# Patient Record
Sex: Female | Born: 1960 | Race: Black or African American | Hispanic: No | Smoking: Current some day smoker
Health system: Southern US, Community
[De-identification: ages and names within clinical notes are randomized; demographics above are authoritative.]

## PROBLEM LIST (undated history)

## (undated) DIAGNOSIS — A539 Syphilis, unspecified: Secondary | ICD-10-CM

## (undated) DIAGNOSIS — J189 Pneumonia, unspecified organism: Secondary | ICD-10-CM

## (undated) DIAGNOSIS — D219 Benign neoplasm of connective and other soft tissue, unspecified: Secondary | ICD-10-CM

## (undated) DIAGNOSIS — A599 Trichomoniasis, unspecified: Secondary | ICD-10-CM

## (undated) DIAGNOSIS — D649 Anemia, unspecified: Secondary | ICD-10-CM

## (undated) DIAGNOSIS — R51 Headache: Secondary | ICD-10-CM

## (undated) HISTORY — DX: Benign neoplasm of connective and other soft tissue, unspecified: D21.9

## (undated) HISTORY — DX: Trichomoniasis, unspecified: A59.9

## (undated) HISTORY — DX: Syphilis, unspecified: A53.9

## (undated) HISTORY — PX: TONSILLECTOMY: SUR1361

---

## 2000-05-18 ENCOUNTER — Emergency Department (HOSPITAL_COMMUNITY): Admission: EM | Admit: 2000-05-18 | Discharge: 2000-05-18 | Payer: Self-pay | Admitting: Emergency Medicine

## 2000-12-31 ENCOUNTER — Other Ambulatory Visit: Admission: RE | Admit: 2000-12-31 | Discharge: 2000-12-31 | Payer: Self-pay | Admitting: Unknown Physician Specialty

## 2001-01-02 ENCOUNTER — Encounter: Admission: RE | Admit: 2001-01-02 | Discharge: 2001-01-02 | Payer: Self-pay | Admitting: *Deleted

## 2001-01-02 ENCOUNTER — Encounter: Payer: Self-pay | Admitting: *Deleted

## 2002-05-04 ENCOUNTER — Other Ambulatory Visit: Admission: RE | Admit: 2002-05-04 | Discharge: 2002-05-04 | Payer: Self-pay | Admitting: *Deleted

## 2002-05-13 ENCOUNTER — Encounter: Payer: Self-pay | Admitting: *Deleted

## 2002-05-13 ENCOUNTER — Encounter: Admission: RE | Admit: 2002-05-13 | Discharge: 2002-05-13 | Payer: Self-pay | Admitting: *Deleted

## 2006-09-18 ENCOUNTER — Ambulatory Visit (HOSPITAL_COMMUNITY): Admission: RE | Admit: 2006-09-18 | Discharge: 2006-09-18 | Payer: Self-pay | Admitting: *Deleted

## 2006-09-18 ENCOUNTER — Encounter (INDEPENDENT_AMBULATORY_CARE_PROVIDER_SITE_OTHER): Payer: Self-pay | Admitting: Specialist

## 2007-10-08 ENCOUNTER — Ambulatory Visit (HOSPITAL_COMMUNITY): Admission: RE | Admit: 2007-10-08 | Discharge: 2007-10-08 | Payer: Self-pay | Admitting: *Deleted

## 2007-12-03 ENCOUNTER — Other Ambulatory Visit: Admission: RE | Admit: 2007-12-03 | Discharge: 2007-12-03 | Payer: Self-pay | Admitting: Obstetrics and Gynecology

## 2008-12-27 ENCOUNTER — Other Ambulatory Visit: Admission: RE | Admit: 2008-12-27 | Discharge: 2008-12-27 | Payer: Self-pay | Admitting: Obstetrics and Gynecology

## 2008-12-28 ENCOUNTER — Encounter: Admission: RE | Admit: 2008-12-28 | Discharge: 2008-12-28 | Payer: Self-pay | Admitting: Obstetrics and Gynecology

## 2010-01-04 ENCOUNTER — Other Ambulatory Visit: Admission: RE | Admit: 2010-01-04 | Discharge: 2010-01-04 | Payer: Self-pay | Admitting: Obstetrics and Gynecology

## 2010-02-12 ENCOUNTER — Encounter: Admission: RE | Admit: 2010-02-12 | Discharge: 2010-02-12 | Payer: Self-pay | Admitting: Obstetrics and Gynecology

## 2011-02-07 ENCOUNTER — Other Ambulatory Visit: Payer: Self-pay | Admitting: Nurse Practitioner

## 2011-02-07 ENCOUNTER — Other Ambulatory Visit (HOSPITAL_COMMUNITY)
Admission: RE | Admit: 2011-02-07 | Discharge: 2011-02-07 | Disposition: A | Discharge status: Home / Self Care | Payer: PRIVATE HEALTH INSURANCE | Source: Ambulatory Visit | Attending: Obstetrics and Gynecology | Admitting: Obstetrics and Gynecology

## 2011-02-07 DIAGNOSIS — Z01419 Encounter for gynecological examination (general) (routine) without abnormal findings: Secondary | ICD-10-CM | POA: Insufficient documentation

## 2011-05-10 NOTE — Op Note (Signed)
NAMENYAJA, DUBUQUE            ACCOUNT NO.:  [id number]   MEDICAL RECORD NO.:  [id number]          PATIENT TYPE:  AMB   LOCATION:  SDC                           FACILITY:  [facility]   PHYSICIAN:  [place] B. Earlene Plater, M.D.  DATE OF BIRTH:  11-Jul-1961   DATE OF PROCEDURE:  09/18/2006  DATE OF DISCHARGE:                                 OPERATIVE REPORT   PREOPERATIVE DIAGNOSES:  1. Excessive menstrual bleeding.  2. Submucous myoma.   POSTOPERATIVE DIAGNOSES:  1. Excessive menstrual bleeding.  2. Submucous myoma.   PROCEDURE:  Hysteroscopic resection of submucous myoma.   ANESTHESIA:  LMA general and 20 mL of Nesacaine 1% paracervical block.   SPECIMENS:  Submucous myoma submitted to pathology.   ESTIMATED BLOOD LOSS:  Minimal.   FLUID DEFICIT:  200 mL sorbitol.   COMPLICATIONS:  None.   INDICATIONS FOR PROCEDURE:  The patient with a history of heavy and  irregular menstrual bleeding.  Sono histogram showed a submucous myoma as  the only abnormality other than some other small intramural fibroids.  The  patient was advised of the risks of surgery including infection, bleeding,  uterine perforation, fluid overload, and damage to surrounding organs.   PROCEDURE:  The patient was taken to the operating room and LMA general  anesthesia obtained.  She was placed in the Mylo stirrups and prepped and  draped in the standard fashion and bladder emptied with in and out cath.  Exam under anesthesia showed a slightly enlarged anteverted uterus.  No  adnexal masses.   Speculum inserted.  Paracervical block placed. The cervix dilated to a #21  without difficulty.  The resectoscope was then inserted and the fibroid  noted to be arising from the anterior lateral portion of the uterus on the  left.  This was excised with the single loop resectoscope to its base.  Hemostasis obtained.  Debris was removed.   Instruments were removed.  Cervix hemostatic.   The patient tolerated the procedure  well without complications.  She was  taken to the recovery room awake, alert and in stable condition.      Gerri Spore B. Earlene Plater, M.D.  Electronically Signed     WBD/MEDQ  D:  09/19/2006  T:  09/22/2006  Job:  [id number]

## 2012-01-17 ENCOUNTER — Encounter: Payer: PRIVATE HEALTH INSURANCE | Admitting: Obstetrics & Gynecology

## 2012-05-23 HISTORY — PX: GANGLION CYST EXCISION: SHX1691

## 2012-06-30 ENCOUNTER — Encounter (HOSPITAL_COMMUNITY): Payer: Self-pay | Admitting: General Practice

## 2012-06-30 ENCOUNTER — Observation Stay (HOSPITAL_COMMUNITY)
Admission: AD | Admit: 2012-06-30 | Discharge: 2012-07-01 | Disposition: A | Discharge status: Home / Self Care | Payer: BC Managed Care – PPO | Source: Ambulatory Visit | Attending: Family Medicine | Admitting: Family Medicine

## 2012-06-30 ENCOUNTER — Ambulatory Visit (INDEPENDENT_AMBULATORY_CARE_PROVIDER_SITE_OTHER): Payer: BC Managed Care – PPO | Admitting: Family Medicine

## 2012-06-30 ENCOUNTER — Ambulatory Visit: Payer: BC Managed Care – PPO

## 2012-06-30 VITALS — BP 108/60 | HR 127 | Temp 98.2°F | Resp 24 | Ht 63.0 in | Wt 166.0 lb

## 2012-06-30 DIAGNOSIS — J189 Pneumonia, unspecified organism: Secondary | ICD-10-CM

## 2012-06-30 DIAGNOSIS — Z23 Encounter for immunization: Secondary | ICD-10-CM | POA: Insufficient documentation

## 2012-06-30 DIAGNOSIS — R059 Cough, unspecified: Secondary | ICD-10-CM

## 2012-06-30 DIAGNOSIS — R05 Cough: Secondary | ICD-10-CM

## 2012-06-30 DIAGNOSIS — F17200 Nicotine dependence, unspecified, uncomplicated: Secondary | ICD-10-CM | POA: Insufficient documentation

## 2012-06-30 HISTORY — DX: Pneumonia, unspecified organism: J18.9

## 2012-06-30 HISTORY — DX: Headache: R51

## 2012-06-30 HISTORY — DX: Anemia, unspecified: D64.9

## 2012-06-30 LAB — POCT CBC
Granulocyte percent: 88.7 %G — AB (ref 37–80)
HCT, POC: 49.4 % — AB (ref 37.7–47.9)
Hemoglobin: 15.9 g/dL (ref 12.2–16.2)
Lymph, poc: 2.5 (ref 0.6–3.4)
MCH, POC: 32.3 pg — AB (ref 27–31.2)
MCHC: 32.2 g/dL (ref 31.8–35.4)
MCV: 100.3 fL — AB (ref 80–97)
MID (cbc): 1 — AB (ref 0–0.9)
MPV: 9 fL (ref 0–99.8)
POC Granulocyte: 27.3 — AB (ref 2–6.9)
POC LYMPH PERCENT: 8.2 %L — AB (ref 10–50)
POC MID %: 3.1 %M (ref 0–12)
Platelet Count, POC: 379 10*3/uL (ref 142–424)
RBC: 4.93 M/uL (ref 4.04–5.48)
RDW, POC: 13.5 %
WBC: 30.8 10*3/uL — AB (ref 4.6–10.2)

## 2012-06-30 LAB — CBC
HCT: 42.2 % (ref 36.0–46.0)
Hemoglobin: 15 g/dL (ref 12.0–15.0)
MCH: 33.3 pg (ref 26.0–34.0)
MCHC: 35.5 g/dL (ref 30.0–36.0)

## 2012-06-30 LAB — ACETAMINOPHEN LEVEL: Acetaminophen (Tylenol), Serum: <15 ug/mL (ref 10–30)

## 2012-06-30 MED ORDER — CEFTRIAXONE SODIUM 1 G IJ SOLR
1.0000 g | Freq: Once | INTRAMUSCULAR | Status: AC
  Administered 2012-06-30: 1 g via INTRAMUSCULAR

## 2012-06-30 MED ORDER — SODIUM CHLORIDE 0.9 % IV BOLUS (SEPSIS)
1000.0000 mL | Freq: Once | INTRAVENOUS | Status: AC
  Administered 2012-06-30: 1000 mL via INTRAVENOUS

## 2012-06-30 MED ORDER — IBUPROFEN 600 MG PO TABS
600.0000 mg | ORAL_TABLET | Freq: Four times a day (QID) | ORAL | Status: DC | PRN
  Filled 2012-06-30: qty 1

## 2012-06-30 MED ORDER — ENOXAPARIN SODIUM 40 MG/0.4ML ~~LOC~~ SOLN
40.0000 mg | SUBCUTANEOUS | Status: DC
  Administered 2012-06-30: 40 mg via SUBCUTANEOUS
  Filled 2012-06-30 (×2): qty 0.4

## 2012-06-30 MED ORDER — SODIUM CHLORIDE 0.9 % IJ SOLN
3.0000 mL | Freq: Two times a day (BID) | INTRAMUSCULAR | Status: DC
  Administered 2012-06-30 – 2012-07-01 (×2): 3 mL via INTRAVENOUS

## 2012-06-30 MED ORDER — ONDANSETRON HCL 4 MG/2ML IJ SOLN: 4.0000 mg | Freq: Four times a day (QID) | INTRAMUSCULAR | Status: DC | PRN

## 2012-06-30 MED ORDER — SODIUM CHLORIDE 0.9 % IJ SOLN: 3.0000 mL | INTRAMUSCULAR | Status: DC | PRN

## 2012-06-30 MED ORDER — DEXTROSE 5 % IV SOLN
500.0000 mg | INTRAVENOUS | Status: DC
  Administered 2012-06-30: 500 mg via INTRAVENOUS
  Filled 2012-06-30 (×2): qty 500

## 2012-06-30 MED ORDER — CEFTRIAXONE SODIUM 1 G IJ SOLR: 1.0000 g | INTRAMUSCULAR | Status: DC

## 2012-06-30 MED ORDER — ONDANSETRON HCL 4 MG PO TABS: 4.0000 mg | ORAL_TABLET | Freq: Four times a day (QID) | ORAL | Status: DC | PRN

## 2012-06-30 MED ORDER — PNEUMOCOCCAL VAC POLYVALENT 25 MCG/0.5ML IJ INJ
0.5000 mL | INJECTION | INTRAMUSCULAR | Status: AC
  Administered 2012-07-01: 0.5 mL via INTRAMUSCULAR
  Filled 2012-06-30: qty 0.5

## 2012-06-30 MED ORDER — DEXTROSE 5 % IV SOLN
1.0000 g | INTRAVENOUS | Status: DC
  Filled 2012-06-30: qty 10

## 2012-06-30 MED ORDER — SODIUM CHLORIDE 0.9 % IV SOLN: 250.0000 mL | INTRAVENOUS | Status: DC | PRN

## 2012-06-30 NOTE — Progress Notes (Signed)
Subjective:    Patient ID: [name], female    DOB: 1961/03/03, 51 y.o.   MRN: [id number]  HPI 51 year old female with productive cough and shortness of breath progressively worsening since July 4th. She states at onset she had a fever of 102, but has not had one for several days. She has cough that is especially bothersome when she is laying down. She had been taking Thera-flu which was not helping so last night she switched to an OTC tylenol cough and cold preparation. States she was "drinking it from the bottle" every 1-2 hours for her cough. Denies sore throat, otalgia, nausea, or vomiting. Does have some nasal congestion and sinus pressure.   She is a smoker but "quit" at onset of this illness. No history of asthma or other lung disease.     Review of Systems  All other systems reviewed and are negative.       Objective:   Physical Exam  Constitutional: She appears well-developed and well-nourished.  HENT:  Head: Normocephalic and atraumatic.  Right Ear: Hearing, tympanic membrane, external ear and ear canal normal.  Left Ear: Hearing, tympanic membrane, external ear and ear canal normal.  Mouth/Throat: Uvula is midline, oropharynx is clear and moist and mucous membranes are normal.  Eyes: Conjunctivae are normal.  Neck: Normal range of motion.  Cardiovascular: Normal rate, regular rhythm and normal heart sounds.   Pulmonary/Chest: Effort normal and breath sounds normal.  Lymphadenopathy:    She has no cervical adenopathy.  Neurological: She is alert.  Psychiatric: She has a normal mood and affect. Her behavior is normal. Judgment and thought content normal.    Results for orders placed in visit on 06/30/12  POCT CBC      Component Value Range   WBC 30.8 (*) 4.6 - 10.2 K/uL   Lymph, poc 2.5  0.6 - 3.4   POC LYMPH PERCENT 8.2 (*) 10 - 50 %L   MID (cbc) 1.0 (*) 0 - 0.9   POC MID % 3.1  0 - 12 %M   POC Granulocyte 27.3 (*) 2 - 6.9   Granulocyte percent 88.7 (*) 37  - 80 %G   RBC 4.93  4.04 - 5.48 M/uL   Hemoglobin 15.9  12.2 - 16.2 g/dL   HCT, POC 40.9 (*) 81.1 - 47.9 %   MCV 100.3 (*) 80 - 97 fL   MCH, POC 32.3 (*) 27 - 31.2 pg   MCHC 32.2  31.8 - 35.4 g/dL   RDW, POC 91.4     Platelet Count, POC 379  142 - 424 K/uL   MPV 9.0  0 - 99.8 fL     [facility] reading (PRIMARY) by  Dr. Patsy Lager as right lower lobe pneumonia with retrocardiac infiltrate and minimal effusion.  Pulso ox on 2 L O2 is 98%  Seen with Dr. Patsy Lager.      Assessment & Plan:   1. Cough  DG Chest 2 View, POCT CBC  2. Pneumonia  Direct admission to [facility].  Patient declined EMS transfer and will go via private vehicle.  cefTRIAXone (ROCEPHIN) injection 1 g, DISCONTINUED: cefTRIAXone (ROCEPHIN) injection 1 g   Addnd per J. Copland, MD:  Also mentioned ?overuse of tylenol containing cold product to inpatient team so they many monitor her liver function.  Rash on wrists does not appear consistent with RMSF, but asked pt to be sure to point this rash out to inpatient team so they may monitor it as  well.

## 2012-06-30 NOTE — H&P (Signed)
Family Medicine Teaching [facility] Admission History and Physical  Patient name: Amy [name] Medical record number: [id number] Date of birth: 05-13-1961 Age: 51 y.o. Gender: female  Primary Care Provider: No primary provider on file.  Chief Complaint: cough History of Present Illness: Amy [name] is a 51 y.o. year old female with no past medical history presenting with cough and shortness of breath x5 days.  Patient reports being in her usual state of good health until July 4th.  At that time she developed some cold symptoms of cough, headache, nasal congestion, and sore/scratchy throat.  She started taking Thera-Flu which did not seem to help much.  She switched to a liquid cold medicine containing tylenol which did seem to help the cough (reports taking some out of the bottle every 1-2 hours overnight).  The cough is non-productive.  Reports fever to 102.5 on the 4th and 5th.  Started to feel a little better over the weekend, but got worse with chills and sweats yesterday.  Tolerating liquid diet at home.  No nausea, vomiting, abdominal, diarrhea, urinary symptoms.  Given worsening of symptoms, she went to Urgent Care, where a CXR was concerning for pneumonia and her white count was elevated at 30,000.   There is no problem list on file for this patient.  Past Medical History: Past Medical History  Diagnosis Date  . Pneumonia 06/30/12  . Anemia   . Headache     "mostly related to weather changing"  . Arthritis     right wrist    Past Surgical History: Past Surgical History  Procedure Date  . Tonsillectomy     "when I was a little girl"  . Cesarean section 1993  . Ganglion cyst excision 05/2012    right wrist    Social History: History   Social History  . Marital Status: Legally Separated    Spouse Name: N/A    Number of Children: N/A  . Years of Education: N/A   Social History Main Topics  . Smoking status: Former Smoker -- 0.5 packs/day for 32 years   Types: Cigarettes    Quit date: 06/25/2012  . Smokeless tobacco: Never Used  . Alcohol Use: 6.0 oz/week    6 Cans of beer, 4 Glasses of wine per week  . Drug Use: No  . Sexually Active: Yes   Other Topics Concern  . None   Social History Narrative  . None    Family History: History reviewed. No pertinent family history.  Allergies: No Known Allergies  Current Facility-Administered Medications  Medication Dose Route Frequency Provider Last Rate Last Dose  . 0.9 %  sodium chloride infusion  250 mL Intravenous PRN Phebe Colla, MD      . azithromycin (ZITHROMAX) 500 mg in dextrose 5 % 250 mL IVPB  500 mg Intravenous Q24H Phebe Colla, MD      . cefTRIAXone (ROCEPHIN) 1 g in dextrose 5 % 50 mL IVPB  1 g Intravenous Q24H Phebe Colla, MD      . enoxaparin (LOVENOX) injection 40 mg  40 mg Subcutaneous Q24H Phebe Colla, MD      . ibuprofen (ADVIL,MOTRIN) tablet 600 mg  600 mg Oral Q6H PRN Phebe Colla, MD      . ondansetron [facility]) tablet 4 mg  4 mg Oral Q6H PRN Phebe Colla, MD       Or  . ondansetron [facility]) injection 4 mg  4 mg Intravenous Q6H PRN Denny Peon  Marlan Palau, MD      . pneumococcal 23 valent vaccine (PNU-IMMUNE) injection 0.5 mL  0.5 mL Intramuscular Tomorrow-1000 Carney Living, MD      . sodium chloride 0.9 % injection 3 mL  3 mL Intravenous Q12H Phebe Colla, MD      . sodium chloride 0.9 % injection 3 mL  3 mL Intravenous PRN Phebe Colla, MD       Review Of Systems: Per HPI with the following additions: none Otherwise 12 point review of systems was performed and was unremarkable.  Physical Exam: Pulse: 107  Blood Pressure: 96/64 RR: 22   O2: 94 on RA Temp: 98.9  General: alert, cooperative, appears stated age, no distress and does have cough HEENT: extra ocular movement intact, sclera clear, anicteric, oropharynx clear, no lesions, neck supple with midline trachea and MMM Heart: S1, S2 normal, no murmur, rub or gallop, regular rate and rhythm Lungs: unlabored  breathing, does appear mildly short of breath with prolonged talking, good air movement, fine rales in right lung base, no wheezes or rhonchi Abdomen: abdomen is soft without significant tenderness, masses, organomegaly or guarding Extremities: extremities normal, atraumatic, no cyanosis or edema Skin:no rashes, no ecchymoses Neurology: normal without focal findings, mental status, speech normal, alert and oriented x3 and cranial nerves 2-12 intact  Labs and Imaging: No results found for this basename: na, k, cl, co2, bun, creatinine, glucose   Lab Results  Component Value Date   WBC 30.8* 06/30/2012   HGB 15.9 06/30/2012   HCT 49.4* 06/30/2012   MCV 100.3* 06/30/2012   CXR: Bibasilar pulmonary infiltrates.  Assessment and Plan: Amy [name] is a 51 y.o. year old female presenting with cough and shortness of breath with CXR concerning for RLL pneumonia.  # Pneumonia: Community acquired.  With tachycardia, tachypnea and elevated white count she technically meets sepsis criteria.  However, she appears clinically well and is hemodynamically stable.   - ceftriaxone and azithromycin IV - will likely be able to transition to PO antibiotics tomorrow - will give 1L NS bolus for tachycardia although she does not appear dry on exam - O2 as needed to keep O2 sats >90% - IS - monitor for fevers - recheck CBC in the morning - given smoking history, will give pneumovax prior to discharge  # Tachycardia: Likely related to acute infection. - will give bolus - monitor  # Tobacco abuse: Last cigarette on 7/3; no plans to restart. - will order tobacco cessation to offer resources - pt declined nicotine patch at this time  FEN/GI: full liquid diet (per patient request); 1L NS bolus then saline lock Prophylaxis: SQ lovenox Disposition: admit for observation  [name], [name] 06/30/2012, 4:29 PM

## 2012-06-30 NOTE — H&P (Signed)
Family Medicine Teaching Service Attending Note  I interviewed and examined patient Amy [name] and reviewed their tests and x-rays.  I discussed with Dr. Elwyn Reach and reviewed their note for today.  I agree with their assessment and plan.     Additionally  Resting comfortably on room air.  Normal conversation Lungs - bilateral crackles at lower lobes no wheeze Chest xray - milld bilateral pneumonia Pneumonia with very low PORT score indicating very low likelihood of complications Treat over night with antibiotics if no worsening anticipate discharge tomorrow

## 2012-07-01 LAB — CBC
MCV: 95.2 fL (ref 78.0–100.0)
Platelets: 317 10*3/uL (ref 150–400)
RBC: 4.16 MIL/uL (ref 3.87–5.11)
RDW: 13.1 % (ref 11.5–15.5)
WBC: 20.2 10*3/uL — ABNORMAL HIGH (ref 4.0–10.5)

## 2012-07-01 MED ORDER — DIPHENHYDRAMINE HCL 25 MG PO CAPS
25.0000 mg | ORAL_CAPSULE | Freq: Three times a day (TID) | ORAL | Status: DC | PRN
  Administered 2012-07-01: 25 mg via ORAL
  Filled 2012-07-01: qty 1

## 2012-07-01 MED ORDER — AZITHROMYCIN 250 MG PO TABS: ORAL_TABLET | ORAL | Status: AC

## 2012-07-01 MED ORDER — DEXTROSE 5 % IV SOLN
1.0000 g | INTRAVENOUS | Status: DC
  Administered 2012-07-01: 1 g via INTRAVENOUS
  Filled 2012-07-01 (×2): qty 10

## 2012-07-01 NOTE — Progress Notes (Signed)
UR complete 

## 2012-07-01 NOTE — Discharge Summary (Signed)
Family Medicine Teaching Service Discharge Summary Amy Curia, MD, PGY-1   07/01/2012 12:15 PM  Amy [name] DOB: 1961/07/01 MRN: [id number]  Date of Admission: 06/30/2012 Date of Discharge: 07/01/12  PCP: No primary provider on file. Consultants: none  Reason for Admission: cough/CAP  Discharge Diagnosis Primary 1. CAP Secondary 1. Tobacco abuse  Hospital Course: [name] is a 51 y.o. year old female presenting with cough and shortness of breath with CXR concerning for RLL pneumonia.  1. CAP - Pt presented with tachycardia, tachypnea, and elevated WBC at 30.8 and was given rocephin/azithromycin.  WBC trended down to 20.2 day of discharge.  Vital signs were stable and pt was afebrile and required no supplemental O2 overnight with O2 sats remaining stable at or above 90%. Pt stated she quit smoking a few days prior to discharge and was given tobacco cessation resources during discharge.  She was discharged home on 4 more days of azithromycin 250 mg and told to follow-up with PCP.  2. Tobacco abuse - Pt states she had her last cigarette on 7/3 with no plans to restart.  Ordered tobacco cessation to offer resources during hospitalization and pt declined nicotine patch.  F/u to provide more support as outpatient.  Day of discharge Physical Exam: VS: BP 123/81  Pulse 97  Temp 99 F (37.2 C) (Oral)  Resp 18  Ht 5\' 3"  (1.6 m)  Wt 166 lb (75.297 kg)  BMI 29.41 kg/m2  SpO2 90%  LMP 11/01/2011 Gen: NAD, pleasant, sitting in hospital bed CV: RRR, no murmurs, rubs, or gallops Pulm: mild inspiratory crackles in bilateral lower lobes, no increased WOB Abd: s/nt/nd, NABS  Procedures:   CXR 7/9: bibasilar pulmonary infiltrates  Discharge Medications Medication List  As of 07/01/2012 12:15 PM   START taking these medications         azithromycin 250 MG tablet   Commonly known as: ZITHROMAX   Take one pill per day for four days.         CONTINUE taking these  medications         pseudoephedrine-acetaminophen 30-500 MG Tabs   Commonly known as: TYLENOL SINUS          Where to get your medications    These are the prescriptions that you need to pick up. We sent them to a specific pharmacy, so you will need to go there to get them.   [id number] - [place], [place] - [address]    [address]  [id number]    Phone: [phone]        azithromycin 250 MG tablet            Pertinent Hospital Labs  WBC 30.8-->20.2 on discharge Tylenol level 7/9 <15  Discharge instructions: see AVS  Condition at discharge: stable  Disposition: to home  Pending Tests: None  Follow up: Follow-up Information    Follow up with your PCP at [facility] in 2 weeks. (As needed)          Follow up Issues:  - For resolving symptoms - For tobacco cessation support

## 2012-07-01 NOTE — Progress Notes (Signed)
Patient was discharged home with family.  Patient was given discharged instructions and verbalized understanding of instructions.  Roland Rack, RN

## 2012-07-01 NOTE — Discharge Summary (Signed)
I have reviewed this discharge summary and agree.    

## 2012-09-24 ENCOUNTER — Other Ambulatory Visit: Payer: Self-pay | Admitting: Internal Medicine

## 2012-09-24 DIAGNOSIS — Z1231 Encounter for screening mammogram for malignant neoplasm of breast: Secondary | ICD-10-CM

## 2012-09-28 ENCOUNTER — Ambulatory Visit
Admission: RE | Admit: 2012-09-28 | Discharge: 2012-09-28 | Disposition: A | Discharge status: Home / Self Care | Payer: BC Managed Care – PPO | Source: Ambulatory Visit | Attending: Internal Medicine | Admitting: Internal Medicine

## 2012-09-28 DIAGNOSIS — Z1231 Encounter for screening mammogram for malignant neoplasm of breast: Secondary | ICD-10-CM

## 2012-10-15 ENCOUNTER — Ambulatory Visit: Payer: PRIVATE HEALTH INSURANCE

## 2013-03-03 ENCOUNTER — Ambulatory Visit (HOSPITAL_COMMUNITY)
Admission: RE | Admit: 2013-03-03 | Discharge: 2013-03-03 | Disposition: A | Discharge status: Home / Self Care | Payer: BC Managed Care – PPO | Source: Ambulatory Visit | Attending: Physician Assistant | Admitting: Physician Assistant

## 2013-03-03 ENCOUNTER — Ambulatory Visit: Payer: BC Managed Care – PPO

## 2013-03-03 ENCOUNTER — Ambulatory Visit (INDEPENDENT_AMBULATORY_CARE_PROVIDER_SITE_OTHER): Payer: BC Managed Care – PPO | Admitting: Emergency Medicine

## 2013-03-03 VITALS — BP 110/70 | HR 57 | Temp 97.9°F | Resp 16 | Ht 63.5 in | Wt 171.0 lb

## 2013-03-03 DIAGNOSIS — R1032 Left lower quadrant pain: Secondary | ICD-10-CM | POA: Insufficient documentation

## 2013-03-03 DIAGNOSIS — N949 Unspecified condition associated with female genital organs and menstrual cycle: Secondary | ICD-10-CM | POA: Insufficient documentation

## 2013-03-03 DIAGNOSIS — A599 Trichomoniasis, unspecified: Secondary | ICD-10-CM

## 2013-03-03 DIAGNOSIS — K5732 Diverticulitis of large intestine without perforation or abscess without bleeding: Secondary | ICD-10-CM | POA: Insufficient documentation

## 2013-03-03 LAB — POCT UA - MICROSCOPIC ONLY
Casts, Ur, LPF, POC: NEGATIVE
Crystals, Ur, HPF, POC: NEGATIVE
Trichomonas, UA: POSITIVE
Yeast, UA: NEGATIVE

## 2013-03-03 LAB — POCT CBC
HCT, POC: 47.5 % (ref 37.7–47.9)
Lymph, poc: 4 — AB (ref 0.6–3.4)
MCH, POC: 31.7 pg — AB (ref 27–31.2)
MCHC: 31.2 g/dL — AB (ref 31.8–35.4)
MCV: 101.7 fL — AB (ref 80–97)
MID (cbc): 0.8 (ref 0–0.9)
POC LYMPH PERCENT: 40.5 %L (ref 10–50)
Platelet Count, POC: 319 10*3/uL (ref 142–424)
RDW, POC: 14.2 %

## 2013-03-03 LAB — POCT URINALYSIS DIPSTICK
Bilirubin, UA: NEGATIVE
Blood, UA: NEGATIVE
Leukocytes, UA: NEGATIVE
Nitrite, UA: NEGATIVE
Urobilinogen, UA: 0.2
pH, UA: 5.5

## 2013-03-03 MED ORDER — CEFTRIAXONE SODIUM 1 G IJ SOLR
1.0000 g | Freq: Once | INTRAMUSCULAR | Status: AC
  Administered 2013-03-03: 1 g via INTRAMUSCULAR

## 2013-03-03 MED ORDER — IOHEXOL 300 MG/ML  SOLN
50.0000 mL | Freq: Once | INTRAMUSCULAR | Status: AC | PRN
  Administered 2013-03-03: 50 mL via ORAL

## 2013-03-03 MED ORDER — IOHEXOL 300 MG/ML  SOLN
100.0000 mL | Freq: Once | INTRAMUSCULAR | Status: AC | PRN
  Administered 2013-03-03: 100 mL via INTRAVENOUS

## 2013-03-03 MED ORDER — METRONIDAZOLE 500 MG PO TABS: 500.0000 mg | ORAL_TABLET | Freq: Two times a day (BID) | ORAL | Status: DC

## 2013-03-03 MED ORDER — CIPROFLOXACIN HCL 500 MG PO TABS: 500.0000 mg | ORAL_TABLET | Freq: Two times a day (BID) | ORAL | Status: DC

## 2013-03-03 NOTE — Patient Instructions (Signed)
Do not eat or drink until after your CT scan today.  Please remain at the imaging center after your CT until you have spoken to [place] on the phone and we have told you your findings and treatment plan. The tech may tell you that it is OK to leave, but please stay until you have instructions from [place]. We suggest your partner be STD tested and treated.  Please use condoms with every sex act.  If you and your partner are not treated for STDs then you will continue to pass the STD to each other.  Remember, condoms do not protect against all STDs!   We will call you with your lab results once we receive them.   If you have not heard from me in 2 weeks, please contact me. The fastest way to get your results is to register for My Chart (see the instructions on the last page of this printout).  We also suggest you set up a colonoscopy since you are now 50.

## 2013-03-03 NOTE — Progress Notes (Signed)
Subjective:    Patient ID: Amy [name], female    DOB: 12-26-1960, 52 y.o.   MRN: [id number]  HPI  A 52 year old female presents with LLQ pain since 03/02/2013.    Pt admits to sharp, stabbing pain in LLQ that is worse with movement and when she is sitting upright.  Admits to increased flatus and relief with BMs and lying down.  Her appetite has been normal and she reports no f/c, n/c, diarrhea, constipation, hematuria, dysuria, polyuria, vaginal discharge, abnormal weight gain or loss.  Her last BM was this morning where she had decreased stool compared to normal.      Her last papsmear was 11/2013 and was normal.  Has a hx of fibroids.  Pt is sexually active and uses no contraceptives.  She has a hx of trichomonas and syphilis which were both tx and resolved.  She is perimenopausal and recently had spotting in 01/2013.  Denies abnormal vaginal bleeding or bloating.   Past Medical History  Diagnosis Date  . Pneumonia 06/30/12  . Anemia   . Headache     "mostly related to weather changing"  . Trichomonas 2 years ago    tx and resolved  . Syphilis 2-3 years ago    tx and resolved  . Fibroids     Past Surgical History  Procedure Laterality Date  . Tonsillectomy      "when I was a little girl"  . Cesarean section  1993  . Ganglion cyst excision  05/2012    right wrist    No Known Allergies  History   Social History  . Marital Status: Legally Separated    Spouse Name: N/A    Number of Children: 1  . Years of Education: N/A   Occupational History  .     Social History Main Topics  . Smoking status: Former Smoker -- 0.50 packs/day for 32 years    Types: Cigarettes    Quit date: 06/25/2012  . Smokeless tobacco: Never Used  . Alcohol Use: 1.2 oz/week    2 Glasses of wine per week  . Drug Use: No  . Sexually Active: Yes     Comment: 1 partner last 6 months   Other Topics Concern  . Not on file   Social History Narrative  . No narrative on file    History  reviewed. No pertinent family history.   Review of Systems   As above.  Objective:   Physical Exam  BP 110/70  Pulse 57  Temp(Src) 97.9 F (36.6 C) (Oral)  Resp 16  Ht 5' 3.5" (1.613 m)  Wt 171 lb (77.565 kg)  BMI 29.81 kg/m2  SpO2 100%  LMP 12/01/2011  General:  Pleasant, overweight female.  NAD. Abdomen:  Normal bowel sounds.  Tenderness of LLQ with increased firmness compared to other quadrants.  No distention or palpable masses.  Negative Murphy's, Psoas, Markle's, and Obturator. GU:  Cervix pink and moist.  No vaginal discharge.  No suspicious vaginal lesions or odor.  Posterior cervical tenderness. Heart:  RRR.  Normal S1,S2.  No m/g/r. Lungs:  CTAB. Neuro:  A&Ox3.  Cranial nerves intact.  Results for orders placed in visit on 03/03/13  POCT CBC      Result Value Range   WBC 10.0  4.6 - 10.2 K/uL   Lymph, poc 4.0 (*) 0.6 - 3.4   POC LYMPH PERCENT 40.5  10 - 50 %L   MID (cbc) 0.8  0 - 0.9  POC MID % 7.9  0 - 12 %M   POC Granulocyte 5.2  2 - 6.9   Granulocyte percent 51.6  37 - 80 %G   RBC 4.67  4.04 - 5.48 M/uL   Hemoglobin 14.8  12.2 - 16.2 g/dL   HCT, POC 16.1  09.6 - 47.9 %   MCV 101.7 (*) 80 - 97 fL   MCH, POC 31.7 (*) 27 - 31.2 pg   MCHC 31.2 (*) 31.8 - 35.4 g/dL   RDW, POC 04.5     Platelet Count, POC 319  142 - 424 K/uL   MPV 9.2  0 - 99.8 fL  POCT UA - MICROSCOPIC ONLY      Result Value Range   WBC, Ur, HPF, POC 6-14     RBC, urine, microscopic 2-6     Bacteria, U Microscopic 3+     Mucus, UA positive     Epithelial cells, urine per micros 8-12     Crystals, Ur, HPF, POC negative     Casts, Ur, LPF, POC negative     Yeast, UA negative     Trichomonas, UA positive    POCT URINALYSIS DIPSTICK      Result Value Range   Color, UA dark yellow     Clarity, UA cloudy     Glucose, UA negative     Bilirubin, UA negative     Ketones, UA negative     Spec Grav, UA >=1.030     Blood, UA negative     pH, UA 5.5     Protein, UA trace      Urobilinogen, UA 0.2     Nitrite, UA negative     Leukocytes, UA Negative    POCT URINE PREGNANCY      Result Value Range   Preg Test, Ur Negative      [facility] reading (PRIMARY) by  Dr. Cleta Alberts.  Acute Abdominal Series:  Nonobstructive bowel pattern.  Please comment on bone island in RIGHT hip.     Assessment & Plan:  Abdominal pain, left lower quadrant - Plan: POCT CBC, POCT UA - Microscopic Only, POCT urinalysis dipstick, POCT urine pregnancy, DG Abd Acute W/Chest, cefTRIAXone (ROCEPHIN) injection 1 g, metroNIDAZOLE (FLAGYL) 500 MG tablet, CT Abdomen Pelvis W Contrast, GC/Chlamydia Probe Amp  Trichomonas - Plan: GC/Chlamydia Probe Amp  Sigmoid diverticulitis   Patient Instructions  Do not eat or drink until after your CT scan today.  Please remain at the imaging center after your CT until you have spoken to [place] on the phone and we have told you your findings and treatment plan. The tech may tell you that it is OK to leave, but please stay until you have instructions from [place]. We suggest your partner be STD tested and treated.  Please use condoms with every sex act.  If you and your partner are not treated for STDs then you will continue to pass the STD to each other.  Remember, condoms do not protect against all STDs!   We will call you with your lab results once we receive them.   If you have not heard from me in 2 weeks, please contact me. The fastest way to get your results is to register for My Chart (see the instructions on the last page of this printout).  We also suggest you set up a colonoscopy since you are now 50.

## 2013-03-03 NOTE — Progress Notes (Signed)
I have examined this patient along with the student and agree.   CT scan of the abdomen and pelvis revealed sigmoid diverticulitis.  Cipro added to the patient's regimen.  She will RTC in 1 week for re-evaluation, sooner if her symptoms worsen or do not begin to improve.

## 2013-03-04 LAB — GC/CHLAMYDIA PROBE AMP: GC Probe RNA: NEGATIVE

## 2013-03-05 DIAGNOSIS — K5732 Diverticulitis of large intestine without perforation or abscess without bleeding: Secondary | ICD-10-CM | POA: Insufficient documentation

## 2013-10-28 ENCOUNTER — Other Ambulatory Visit: Payer: Self-pay

## 2016-04-19 ENCOUNTER — Ambulatory Visit (INDEPENDENT_AMBULATORY_CARE_PROVIDER_SITE_OTHER): Payer: PRIVATE HEALTH INSURANCE | Admitting: Urgent Care

## 2016-04-19 ENCOUNTER — Ambulatory Visit (INDEPENDENT_AMBULATORY_CARE_PROVIDER_SITE_OTHER): Payer: PRIVATE HEALTH INSURANCE

## 2016-04-19 VITALS — BP 122/80 | HR 74 | Temp 97.9°F | Resp 18 | Ht 63.5 in | Wt 182.0 lb

## 2016-04-19 DIAGNOSIS — M545 Low back pain, unspecified: Secondary | ICD-10-CM

## 2016-04-19 DIAGNOSIS — M62830 Muscle spasm of back: Secondary | ICD-10-CM

## 2016-04-19 MED ORDER — NAPROXEN SODIUM 550 MG PO TABS: 550.0000 mg | ORAL_TABLET | Freq: Two times a day (BID) | ORAL | Status: DC

## 2016-04-19 MED ORDER — CYCLOBENZAPRINE HCL 5 MG PO TABS: 5.0000 mg | ORAL_TABLET | Freq: Three times a day (TID) | ORAL | Status: DC | PRN

## 2016-04-19 NOTE — Patient Instructions (Addendum)
Back Pain, Adult °Back pain is very common in adults. The cause of back pain is rarely dangerous and the pain often gets better over time. The cause of your back pain may not be known. Some common causes of back pain include: °· Strain of the muscles or ligaments supporting the spine. °· Wear and tear (degeneration) of the spinal disks. °· Arthritis. °· Direct injury to the back. °For many people, back pain may return. Since back pain is rarely dangerous, most people can learn to manage this condition on their own. °HOME CARE INSTRUCTIONS °Watch your back pain for any changes. The following actions may help to lessen any discomfort you are feeling: °· Remain active. It is stressful on your back to sit or stand in one place for long periods of time. Do not sit, drive, or stand in one place for more than 30 minutes at a time. Take short walks on even surfaces as soon as you are able. Try to increase the length of time you walk each day. °· Exercise regularly as directed by your health care provider. Exercise helps your back heal faster. It also helps avoid future injury by keeping your muscles strong and flexible. °· Do not stay in bed. Resting more than 1-2 days can delay your recovery. °· Pay attention to your body when you bend and lift. The most comfortable positions are those that put less stress on your recovering back. Always use proper lifting techniques, including: °· Bending your knees. °· Keeping the load close to your body. °· Avoiding twisting. °· Find a comfortable position to sleep. Use a firm mattress and lie on your side with your knees slightly bent. If you lie on your back, put a pillow under your knees. °· Avoid feeling anxious or stressed. Stress increases muscle tension and can worsen back pain. It is important to recognize when you are anxious or stressed and learn ways to manage it, such as with exercise. °· Take medicines only as directed by your health care provider. Over-the-counter  medicines to reduce pain and inflammation are often the most helpful. Your health care provider may prescribe muscle relaxant drugs. These medicines help dull your pain so you can more quickly return to your normal activities and healthy exercise. °· Apply ice to the injured area: °· Put ice in a plastic bag. °· Place a towel between your skin and the bag. °· Leave the ice on for 20 minutes, 2-3 times a day for the first 2-3 days. After that, ice and heat may be alternated to reduce pain and spasms. °· Maintain a healthy weight. Excess weight puts extra stress on your back and makes it difficult to maintain good posture. °SEEK MEDICAL CARE IF: °· You have pain that is not relieved with rest or medicine. °· You have increasing pain going down into the legs or buttocks. °· You have pain that does not improve in one week. °· You have night pain. °· You lose weight. °· You have a fever or chills. °SEEK IMMEDIATE MEDICAL CARE IF:  °· You develop new bowel or bladder control problems. °· You have unusual weakness or numbness in your arms or legs. °· You develop nausea or vomiting. °· You develop abdominal pain. °· You feel faint. °  °This information is not intended to replace advice given to you by your health care provider. Make sure you discuss any questions you have with your health care provider. °  °Document Released: 12/09/2005 Document Revised: 12/30/2014 Document Reviewed: 04/12/2014 °Elsevier Interactive Patient Education ©2016 Elsevier   Inc.    Muscle Cramps and Spasms Muscle cramps and spasms occur when a muscle or muscles tighten and you have no control over this tightening (involuntary muscle contraction). They are a common problem and can develop in any muscle. The most common place is in the calf muscles of the leg. Both muscle cramps and muscle spasms are involuntary muscle contractions, but they also have differences:   Muscle cramps are sporadic and painful. They may last a few seconds to a  quarter of an hour. Muscle cramps are often more forceful and last longer than muscle spasms.  Muscle spasms may or may not be painful. They may also last just a few seconds or much longer. CAUSES  It is uncommon for cramps or spasms to be due to a serious underlying problem. In many cases, the cause of cramps or spasms is unknown. Some common causes are:   Overexertion.   Overuse from repetitive motions (doing the same thing over and over).   Remaining in a certain position for a long period of time.   Improper preparation, form, or technique while performing a sport or activity.   Dehydration.   Injury.   Side effects of some medicines.   Abnormally low levels of the salts and ions in your blood (electrolytes), especially potassium and calcium. This could happen if you are taking water pills (diuretics) or you are pregnant.  Some underlying medical problems can make it more likely to develop cramps or spasms. These include, but are not limited to:   Diabetes.   Parkinson disease.   Hormone disorders, such as thyroid problems.   Alcohol abuse.   Diseases specific to muscles, joints, and bones.   Blood vessel disease where not enough blood is getting to the muscles.  HOME CARE INSTRUCTIONS   Stay well hydrated. Drink enough water and fluids to keep your urine clear or pale yellow.  It may be helpful to massage, stretch, and relax the affected muscle.  For tight or tense muscles, use a warm towel, heating pad, or hot shower water directed to the affected area.  If you are sore or have pain after a cramp or spasm, applying ice to the affected area may relieve discomfort.  Put ice in a plastic bag.  Place a towel between your skin and the bag.  Leave the ice on for 15-20 minutes, 03-04 times a day.  Medicines used to treat a known cause of cramps or spasms may help reduce their frequency or severity. Only take over-the-counter or prescription medicines as  directed by your caregiver. SEEK MEDICAL CARE IF:  Your cramps or spasms get more severe, more frequent, or do not improve over time.  MAKE SURE YOU:   Understand these instructions.  Will watch your condition.  Will get help right away if you are not doing well or get worse.   This information is not intended to replace advice given to you by your health care provider. Make sure you discuss any questions you have with your health care provider.   Document Released: 05/31/2002 Document Revised: 04/05/2013 Document Reviewed: 11/25/2012 Elsevier Interactive Patient Education 2016 [employer].   IF you received an x-ray today, you will receive an invoice from [facility]. Please contact [facility] at [phone] with questions or concerns regarding your invoice.   IF you received labwork today, you will receive an invoice from [employer]. Please contact [facility] at [phone] with questions or concerns regarding your invoice.  Our billing staff will not be able to assist you with questions regarding bills from these companies.  You will be contacted with the lab results as soon as they are available. The fastest way to get your results is to activate your My Chart account. Instructions are located on the last page of this paperwork. If you have not heard from [place] regarding the results in 2 weeks, please contact this office.

## 2016-04-19 NOTE — Progress Notes (Signed)
    MRN: [id number] DOB: 01-Dec-1961  Subjective:   Amy [name] is a 55 y.o. female presenting for chief complaint of Back Pain  Reports 3 week history of low back pain. Pain is occuring every day, is sharp and lasts half days. Has tried pain relieving patches with some relief. She has also tried Alleve with a couple of hours of relief. Of note, patient did start a new job 3 weeks ago where she has to do a lot of lifting and bending. Generally she is not lifting very heavy objects. Denies fever, radiation of her back pain, incontinence, numbness or tingling, trauma, n/v, abdominal pain, constipation, dysuria, hematuria.    Amy [name] currently has no medications in their medication list. Also has No Known Allergies.  Amy [name]  has a past medical history of Pneumonia (06/30/12); Anemia; Headache(784.0); Trichomonas (2 years ago); Syphilis (2-3 years ago); and Fibroids. Also  has past surgical history that includes Tonsillectomy; Cesarean section (1993); and Ganglion cyst excision (05/2012).  Objective:   Vitals: BP 122/80 mmHg  Pulse 74  Temp(Src) 97.9 F (36.6 C) (Oral)  Resp 18  Ht 5' 3.5" (1.613 m)  Wt 182 lb (82.555 kg)  BMI 31.73 kg/m2  SpO2 96%  Physical Exam  Constitutional: She is oriented to person, place, and time. She appears well-developed.  Cardiovascular: Normal rate.   Pulmonary/Chest: Effort normal.  Musculoskeletal:       Lumbar back: She exhibits tenderness (over area depicted) and spasm. She exhibits normal range of motion, no bony tenderness, no swelling, no edema, no deformity and no laceration.       Back:  Neurological: She is alert and oriented to person, place, and time. She has normal reflexes.  Skin: Skin is warm and dry.   Dg Lumbar Spine Complete  04/19/2016  CLINICAL DATA:  Low back pain for the past 3 weeks. EXAM: LUMBAR SPINE - COMPLETE 4+ VIEW COMPARISON:  Abdomen and pelvis CT dated 03/03/2013. FINDINGS: Five non-rib-bearing lumbar vertebrae. Minimal  dextroconvex scoliosis. Minimal anterior spur formation at multiple levels. Mild facet degenerative changes in the lower lumbar spine. No fractures, pars defects or subluxations. IMPRESSION: Mild degenerative changes.  No acute abnormality. Electronically Signed   By: Claudie Revering M.D.   On: 04/19/2016 17:21    Assessment and Plan :   1. Spasm of lumbar paraspinous muscle 2. Right-sided low back pain without sciatica - X-ray findings and physical exam very reassuring. Will opt for conservative management, lifestyle modifications. Patient is to rtc in 2 weeks if no improvement. Consider physical therapy at that point.  Jaynee Eagles, PA-C [facility] [phone] 04/19/2016 4:48 PM

## 2016-05-19 ENCOUNTER — Encounter (HOSPITAL_COMMUNITY): Payer: Self-pay | Admitting: Emergency Medicine

## 2016-05-19 ENCOUNTER — Ambulatory Visit (HOSPITAL_COMMUNITY)
Admission: EM | Admit: 2016-05-19 | Discharge: 2016-05-19 | Disposition: A | Discharge status: Home / Self Care | Payer: PRIVATE HEALTH INSURANCE | Attending: Emergency Medicine | Admitting: Emergency Medicine

## 2016-05-19 ENCOUNTER — Ambulatory Visit (INDEPENDENT_AMBULATORY_CARE_PROVIDER_SITE_OTHER): Payer: PRIVATE HEALTH INSURANCE

## 2016-05-19 DIAGNOSIS — T148 Other injury of unspecified body region: Secondary | ICD-10-CM

## 2016-05-19 DIAGNOSIS — M1711 Unilateral primary osteoarthritis, right knee: Secondary | ICD-10-CM

## 2016-05-19 DIAGNOSIS — M179 Osteoarthritis of knee, unspecified: Secondary | ICD-10-CM | POA: Diagnosis not present

## 2016-05-19 DIAGNOSIS — X503XXA Overexertion from repetitive movements, initial encounter: Secondary | ICD-10-CM

## 2016-05-19 DIAGNOSIS — T148XXA Other injury of unspecified body region, initial encounter: Principal | ICD-10-CM

## 2016-05-19 NOTE — Discharge Instructions (Signed)
How to Use a Knee Brace A knee brace is a device that you wear to support your knee, especially if the knee is healing after an injury or surgery. There are several types of knee braces. Some are designed to prevent an injury (prophylactic brace). These are often worn during sports. Others support an injured knee (functional brace) or keep it still while it heals (rehabilitative brace). People with severe arthritis of the knee may benefit from a brace that takes some pressure off the knee (unloader brace). Most knee braces are made from a combination of cloth and metal or plastic.  You may need to wear a knee brace to:  Relieve knee pain.  Help your knee support your weight (improve stability).  Help you walk farther (improve mobility).  Prevent injury.  Support your knee while it heals from surgery or from an injury. RISKS AND COMPLICATIONS Generally, knee braces are very safe to wear. However, problems may occur, including:  Skin irritation that may lead to infection.  Making your condition worse if you wear the brace in the wrong way. HOW TO USE A KNEE BRACE Different braces will have different instructions for use. Your health care provider will tell you or show you:  How to put on your brace.  How to adjust the brace.  When and how often to wear the brace.  How to remove the brace.  If you will need any assistive devices in addition to the brace, such as crutches or a cane. In general, your brace should:  Have the hinge of the brace line up with the bend of your knee.  Have straps, hooks, or tapes that fasten snugly around your leg.  Not feel too tight or too loose. HOW TO CARE FOR A KNEE BRACE  Check your brace often for signs of damage, such as loose connections or attachments. Your knee brace may get damaged or wear out during normal use.  Wash the fabric parts of your brace with soap and water.  Read the insert that comes with your brace for other specific care  instructions. SEEK MEDICAL CARE IF:  Your knee brace is too loose or too tight and you cannot adjust it.  Your knee brace causes skin redness, swelling, bruising, or irritation.  Your knee brace is not helping.  Your knee brace is making your knee pain worse.   This information is not intended to replace advice given to you by your health care provider. Make sure you discuss any questions you have with your health care provider.   Document Released: 02/29/2004 Document Revised: 08/30/2015 Document Reviewed: 04/03/2015 Elsevier Interactive Patient Education 2016 [place] therapy can help ease sore, stiff, injured, and tight muscles and joints. Heat relaxes your muscles, which may help ease your pain.  RISKS AND COMPLICATIONS If you have any of the following conditions, do not use heat therapy unless your health care provider has approved:  Poor circulation.  Healing wounds or scarred skin in the area being treated.  Diabetes, heart disease, or high blood pressure.  Not being able to feel (numbness) the area being treated.  Unusual swelling of the area being treated.  Active infections.  Blood clots.  Cancer.  Inability to communicate pain. This may include young children and people who have problems with their brain function (dementia).  Pregnancy. Heat therapy should only be used on old, pre-existing, or long-lasting (chronic) injuries. Do not use heat therapy on new injuries unless directed by  your health care provider. HOW TO USE HEAT THERAPY There are several different kinds of heat therapy, including:  Moist heat pack.  Warm water bath.  Hot water bottle.  Electric heating pad.  Heated gel pack.  Heated wrap.  Electric heating pad. Use the heat therapy method suggested by your health care provider. Follow your health care provider's instructions on when and how to use heat therapy. GENERAL HEAT THERAPY RECOMMENDATIONS  Do not  sleep while using heat therapy. Only use heat therapy while you are awake.  Your skin may turn pink while using heat therapy. Do not use heat therapy if your skin turns red.  Do not use heat therapy if you have new pain.  High heat or long exposure to heat can cause burns. Be careful when using heat therapy to avoid burning your skin.  Do not use heat therapy on areas of your skin that are already irritated, such as with a rash or sunburn. SEEK MEDICAL CARE IF:  You have blisters, redness, swelling, or numbness.  You have new pain.  Your pain is worse. MAKE SURE YOU:  Understand these instructions.  Will watch your condition.  Will get help right away if you are not doing well or get worse.   This information is not intended to replace advice given to you by your health care provider. Make sure you discuss any questions you have with your health care provider.   Document Released: 03/02/2012 Document Revised: 12/30/2014 Document Reviewed: 02/01/2014 Elsevier Interactive Patient Education 2016 [place]  Arthritis Arthritis means joint pain. It can also mean joint disease. A joint is a place where bones come together. People who have arthritis may have:  Red joints.  Swollen joints.  Stiff joints.  Warm joints.  A fever.  A feeling of being sick. HOME CARE Pay attention to any changes in your symptoms. Take these actions to help with your pain and swelling. Medicines  Take over-the-counter and prescription medicines only as told by your doctor.  Do not take aspirin for pain if your doctor says that you may have gout. Activities  Rest your joint if your doctor tells you to.  Avoid activities that make the pain worse.  Exercise your joint regularly as told by your doctor. Try doing exercises like:  Swimming.  Water aerobics.  Biking.  Walking. Joint Care  If your joint is swollen, keep it raised (elevated) if told by your doctor.  If your joint  feels stiff in the morning, try taking a warm shower.  If you have diabetes, do not apply heat without asking your doctor.  If told, apply heat to the joint:  Put a towel between the joint and the hot pack or heating pad.  Leave the heat on the area for 20-30 minutes.  If told, apply ice to the joint:  Put ice in a plastic bag.  Place a towel between your skin and the bag.  Leave the ice on for 20 minutes, 2-3 times per day.  Keep all follow-up visits as told by your doctor. GET HELP IF:  The pain gets worse.  You have a fever. GET HELP RIGHT AWAY IF:  You have very bad pain in your joint.  You have swelling in your joint.  Your joint is red.  Many joints become painful and swollen.  You have very bad back pain.  Your leg is very weak.  You cannot control your pee (urine) or poop (stool).   This information  is not intended to replace advice given to you by your health care provider. Make sure you discuss any questions you have with your health care provider.   Document Released: 03/05/2010 Document Revised: 08/30/2015 Document Reviewed: 03/06/2015 Elsevier Interactive Patient Education [employer].

## 2016-05-19 NOTE — ED Provider Notes (Signed)
CSN: [id number]     Arrival date & time 05/19/16  1254 History   First MD Initiated Contact with Patient 05/19/16 1335     Chief Complaint  Patient presents with  . Knee Pain  . Back Pain   (Consider location/radiation/quality/duration/timing/severity/associated sxs/prior Treatment) HPI History obtained from patient:  Pt presents with the cc of:  Right knee pain Duration of symptoms: Several weeks Treatment prior to arrival: None Context: Recently started new job about 2 weeks ago which is about standing for 12 hours.  Other symptoms include: Right foot and back pain Pain score: 4 FAMILY HISTORY: No family history of cancer    Past Medical History  Diagnosis Date  . Pneumonia 06/30/12  . Anemia   . Headache(784.0)     "mostly related to weather changing"  . Trichomonas 2 years ago    tx and resolved  . Syphilis 2-3 years ago    tx and resolved  . Fibroids    Past Surgical History  Procedure Laterality Date  . Tonsillectomy      "when I was a little girl"  . Cesarean section  1993  . Ganglion cyst excision  05/2012    right wrist   History reviewed. No pertinent family history. Social History  Substance Use Topics  . Smoking status: Former Smoker -- 0.50 packs/day for 32 years    Types: Cigarettes    Quit date: 06/25/2012  . Smokeless tobacco: Never Used  . Alcohol Use: 1.2 oz/week    2 Glasses of wine per week   OB History    No data available     Review of Systems  Denies: HEADACHE, NAUSEA, ABDOMINAL PAIN, CHEST PAIN, CONGESTION, DYSURIA, SHORTNESS OF BREATH  Allergies  Review of patient's allergies indicates no known allergies.  Home Medications   Prior to Admission medications   Medication Sig Start Date End Date Taking? Authorizing Provider  cyclobenzaprine (FLEXERIL) 5 MG tablet Take 1-2 tablets (5-10 mg total) by mouth 3 (three) times daily as needed for muscle spasms. 04/19/16  Yes Jaynee Eagles, PA-C  naproxen sodium (ANAPROX DS) 550 MG tablet Take 1  tablet (550 mg total) by mouth 2 (two) times daily with a meal. 04/19/16  Yes Jaynee Eagles, PA-C   Meds Ordered and Administered this Visit  Medications - No data to display  BP 145/81 mmHg  Pulse 69  Temp(Src) 98.5 F (36.9 C) (Oral)  Resp 18  SpO2 97% No data found.   Physical Exam NURSES NOTES AND VITAL SIGNS REVIEWED. CONSTITUTIONAL: Well developed, well nourished, no acute distress HEENT: normocephalic, atraumatic EYES: Conjunctiva normal NECK:normal ROM, supple, no adenopathy PULMONARY:No respiratory distress, normal effort ABDOMINAL: Soft, ND, NT BS+, No CVAT MUSCULOSKELETAL: Normal ROM of all extremities, right knee is minimally swollen there is no joint effusion noted. Full range of motion joint is stable to valgus and varus stresses Lachman test is negative. SKIN: warm and dry without rash PSYCHIATRIC: Mood and affect, behavior are normal  ED Course  Procedures (including critical care time)  Labs Review Labs Reviewed - No data to display  Imaging Review Dg Knee Complete 4 Views Right  05/19/2016  CLINICAL DATA:  Right knee pain, no known injury, initial encounter EXAM: RIGHT KNEE - COMPLETE 4+ VIEW COMPARISON:  None. FINDINGS: No evidence of fracture, dislocation, or joint effusion. No evidence of arthropathy or other focal bone abnormality. Soft tissues are unremarkable. IMPRESSION: No acute abnormality noted. Electronically Signed   By: Linus Mako.D.  On: 05/19/2016 14:05   I HAVE PERSONALLY  REVIEWED AND DISCUSSED RESULTS OF  X-RAYS WITH PATIENT  PRIOR TO DISCHARGE.     Visual Acuity Review  Right Eye Distance:   Left Eye Distance:   Bilateral Distance:    Right Eye Near:   Left Eye Near:    Bilateral Near:         MDM   1. Repetitive strain injury   2. Osteoarthritis of right knee, unspecified osteoarthritis type     Patient is reassured that there are no issues that require transfer to higher level of care at this time or additional  tests. Patient is advised to continue home symptomatic treatment. Patient is advised that if there are new or worsening symptoms to attend the emergency department, contact primary care provider, or return to UC. Instructions of care provided discharged home in stable condition.    THIS NOTE WAS GENERATED USING A VOICE RECOGNITION SOFTWARE PROGRAM. ALL REASONABLE EFFORTS  WERE MADE TO PROOFREAD THIS DOCUMENT FOR ACCURACY.  I have verbally reviewed the discharge instructions with the patient. A printed AVS was given to the patient.  All questions were answered prior to discharge.      Konrad Felix, [place] 05/19/16 1446

## 2016-05-19 NOTE — ED Notes (Signed)
The patient presented to the [facility] with a complaint of right knee pain and lower back pain bilaterally for 2 weeks that she attributes to her new 12 hour job.

## 2017-08-27 IMAGING — CR DG LUMBAR SPINE COMPLETE 4+V
5 series · 5 of 5 positions shown · non-contrast
Comparison: Abdomen and pelvis CT dated 03/03/2013.

CLINICAL DATA: Low back pain for the past 3 weeks.

EXAM:
LUMBAR SPINE - COMPLETE 4+ VIEW

[AP (1 of 2)]
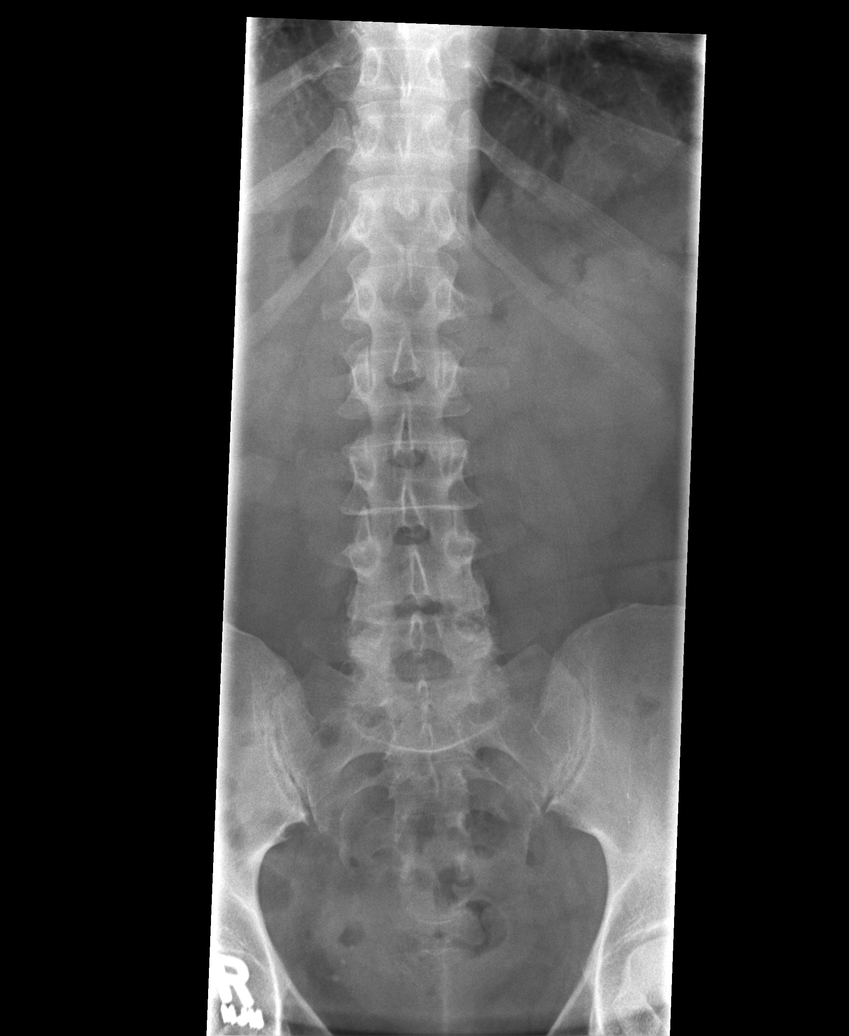

[rpo]
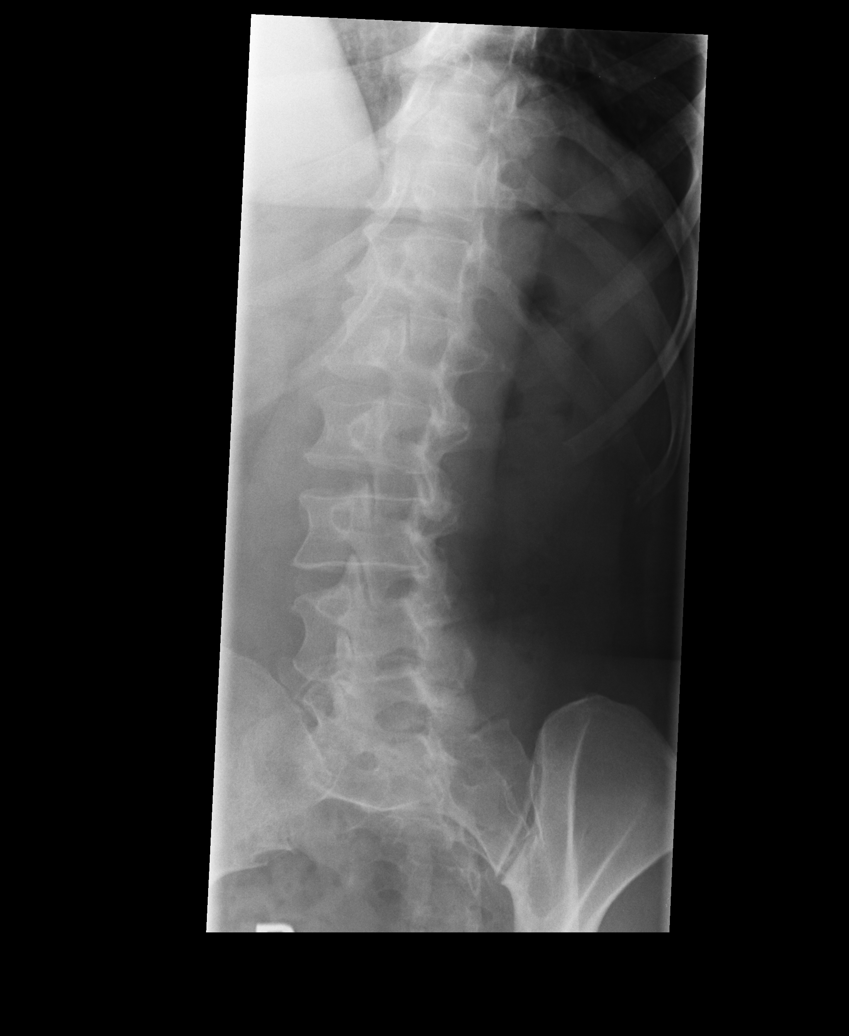

[lpo]
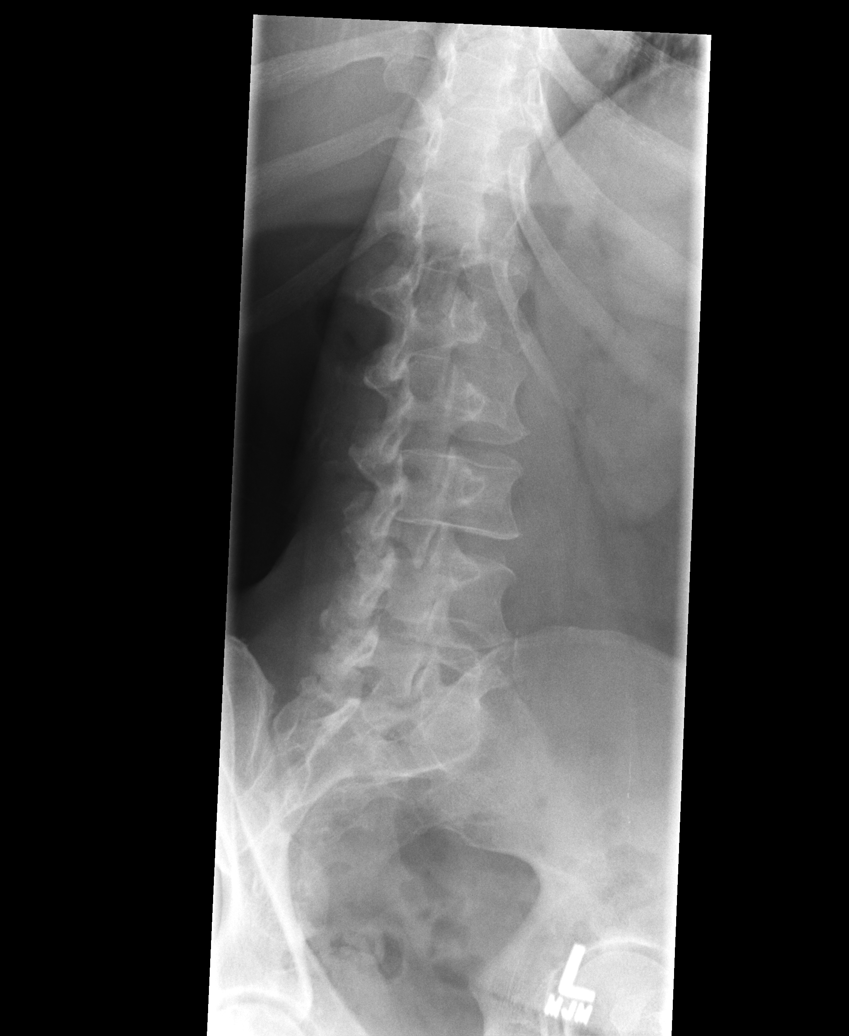

[AP (2 of 2)]
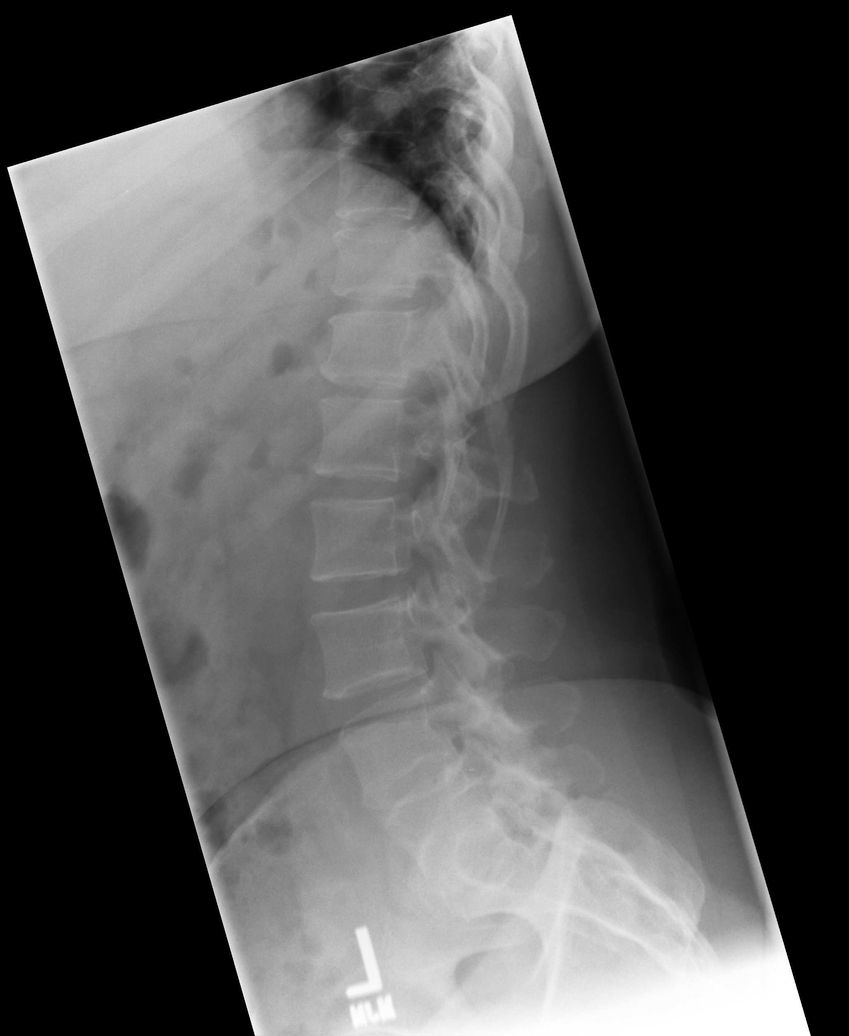

[l5 s1]
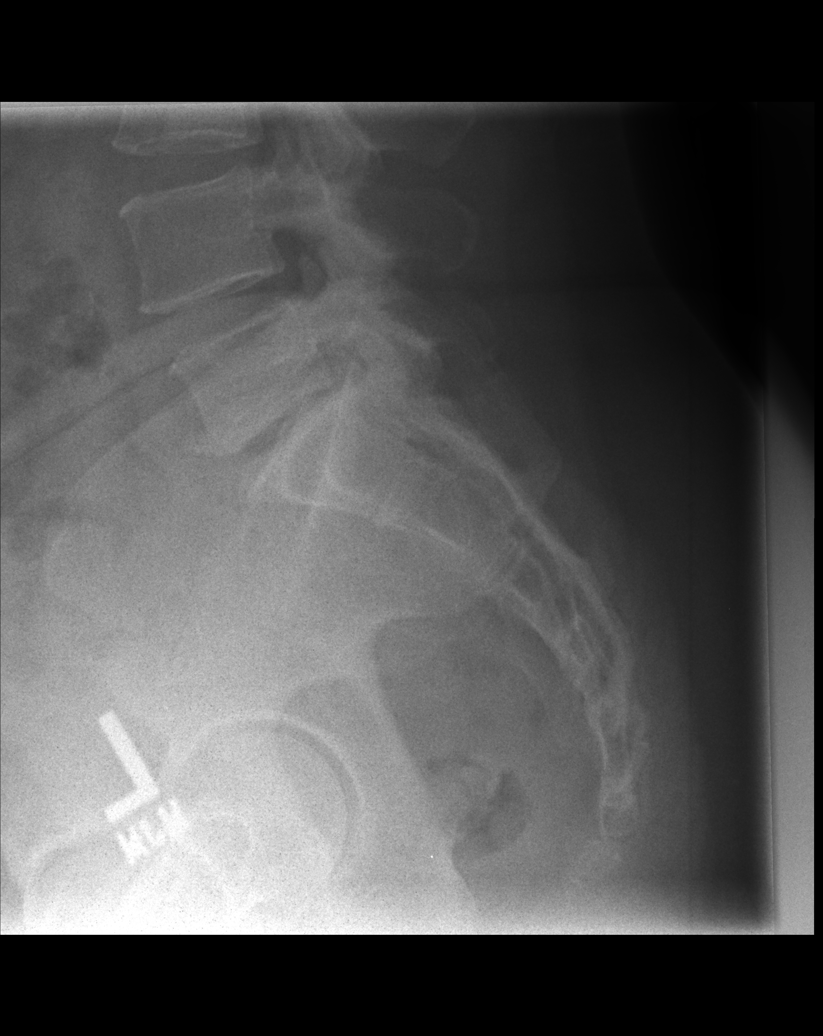

[5 of 5 positions shown; findings below may reference images not displayed]

FINDINGS: Five non-rib-bearing lumbar vertebrae. Minimal dextroconvex
scoliosis. Minimal anterior spur formation at multiple levels. Mild
facet degenerative changes in the lower lumbar spine. No fractures,
pars defects or subluxations.
IMPRESSION: Mild degenerative changes.  No acute abnormality.

## 2021-10-30 ENCOUNTER — Inpatient Hospital Stay (HOSPITAL_COMMUNITY)
Admission: EM | Admit: 2021-10-30 | Discharge: 2021-11-03 | DRG: 392 | Disposition: A | Discharge status: Home / Self Care | Payer: Self-pay | Attending: Surgery | Admitting: Surgery

## 2021-10-30 ENCOUNTER — Emergency Department (HOSPITAL_COMMUNITY): Payer: Self-pay

## 2021-10-30 ENCOUNTER — Encounter (HOSPITAL_COMMUNITY): Payer: Self-pay

## 2021-10-30 DIAGNOSIS — Z79899 Other long term (current) drug therapy: Secondary | ICD-10-CM

## 2021-10-30 DIAGNOSIS — Z23 Encounter for immunization: Secondary | ICD-10-CM

## 2021-10-30 DIAGNOSIS — F17210 Nicotine dependence, cigarettes, uncomplicated: Secondary | ICD-10-CM | POA: Diagnosis present

## 2021-10-30 DIAGNOSIS — Z20822 Contact with and (suspected) exposure to covid-19: Secondary | ICD-10-CM | POA: Diagnosis present

## 2021-10-30 DIAGNOSIS — K5720 Diverticulitis of large intestine with perforation and abscess without bleeding: Principal | ICD-10-CM | POA: Diagnosis present

## 2021-10-30 LAB — URINALYSIS, ROUTINE W REFLEX MICROSCOPIC
Bilirubin Urine: NEGATIVE
Glucose, UA: NEGATIVE mg/dL
Hgb urine dipstick: NEGATIVE
Ketones, ur: NEGATIVE mg/dL
Leukocytes,Ua: NEGATIVE
Nitrite: NEGATIVE
Protein, ur: NEGATIVE mg/dL
Specific Gravity, Urine: 1.023 (ref 1.005–1.030)
pH: 6 (ref 5.0–8.0)

## 2021-10-30 LAB — COMPREHENSIVE METABOLIC PANEL
ALT: 11 U/L (ref 0–44)
AST: 14 U/L — ABNORMAL LOW (ref 15–41)
Albumin: 3.7 g/dL (ref 3.5–5.0)
Alkaline Phosphatase: 63 U/L (ref 38–126)
Anion gap: 11 (ref 5–15)
BUN: 8 mg/dL (ref 6–20)
CO2: 25 mmol/L (ref 22–32)
Calcium: 9.7 mg/dL (ref 8.9–10.3)
Chloride: 102 mmol/L (ref 98–111)
Creatinine, Ser: 1 mg/dL (ref 0.44–1.00)
GFR, Estimated: >60 mL/min (ref >60)
Glucose, Bld: 122 mg/dL — ABNORMAL HIGH (ref 70–99)
Potassium: 4.1 mmol/L (ref 3.5–5.1)
Sodium: 138 mmol/L (ref 135–145)
Total Bilirubin: 0.8 mg/dL (ref 0.3–1.2)
Total Protein: 7.7 g/dL (ref 6.5–8.1)

## 2021-10-30 LAB — CBC
HCT: 42.8 % (ref 36.0–46.0)
Hemoglobin: 14.2 g/dL (ref 12.0–15.0)
MCH: 33.5 pg (ref 26.0–34.0)
MCHC: 33.2 g/dL (ref 30.0–36.0)
MCV: 100.9 fL — ABNORMAL HIGH (ref 80.0–100.0)
Platelets: 377 10*3/uL (ref 150–400)
RBC: 4.24 MIL/uL (ref 3.87–5.11)
RDW: 12.2 % (ref 11.5–15.5)
WBC: 13.6 10*3/uL — ABNORMAL HIGH (ref 4.0–10.5)
nRBC: 0 % (ref 0.0–0.2)

## 2021-10-30 LAB — RESP PANEL BY RT-PCR (FLU A&B, COVID) ARPGX2
Influenza A by PCR: NEGATIVE
Influenza B by PCR: NEGATIVE
SARS Coronavirus 2 by RT PCR: NEGATIVE

## 2021-10-30 LAB — HIV ANTIBODY (ROUTINE TESTING W REFLEX): HIV Screen 4th Generation wRfx: NONREACTIVE

## 2021-10-30 LAB — LIPASE, BLOOD: Lipase: 24 U/L (ref 11–51)

## 2021-10-30 MED ORDER — SIMETHICONE 80 MG PO CHEW: 40.0000 mg | CHEWABLE_TABLET | Freq: Four times a day (QID) | ORAL | Status: DC | PRN

## 2021-10-30 MED ORDER — LACTATED RINGERS IV BOLUS
1000.0000 mL | Freq: Once | INTRAVENOUS | Status: AC
  Administered 2021-10-30: 1000 mL via INTRAVENOUS

## 2021-10-30 MED ORDER — KCL IN DEXTROSE-NACL 20-5-0.45 MEQ/L-%-% IV SOLN
INTRAVENOUS | Status: DC
  Filled 2021-10-30 (×9): qty 1000

## 2021-10-30 MED ORDER — MORPHINE SULFATE (PF) 4 MG/ML IV SOLN
4.0000 mg | Freq: Once | INTRAVENOUS | Status: AC
  Administered 2021-10-30: 4 mg via INTRAVENOUS
  Filled 2021-10-30: qty 1

## 2021-10-30 MED ORDER — MORPHINE SULFATE (PF) 2 MG/ML IV SOLN
1.0000 mg | INTRAVENOUS | Status: DC | PRN
  Administered 2021-10-30: 4 mg via INTRAVENOUS
  Administered 2021-10-31: 2 mg via INTRAVENOUS
  Filled 2021-10-30: qty 2
  Filled 2021-10-30: qty 1

## 2021-10-30 MED ORDER — ACETAMINOPHEN 500 MG PO TABS
1000.0000 mg | ORAL_TABLET | Freq: Four times a day (QID) | ORAL | Status: DC
  Administered 2021-10-30 – 2021-11-03 (×12): 1000 mg via ORAL
  Filled 2021-10-30 (×13): qty 2

## 2021-10-30 MED ORDER — IOHEXOL 350 MG/ML SOLN
100.0000 mL | Freq: Once | INTRAVENOUS | Status: AC | PRN
  Administered 2021-10-30: 100 mL via INTRAVENOUS

## 2021-10-30 MED ORDER — ONDANSETRON HCL 4 MG/2ML IJ SOLN
4.0000 mg | Freq: Four times a day (QID) | INTRAMUSCULAR | Status: DC | PRN
  Filled 2021-10-30: qty 2

## 2021-10-30 MED ORDER — DIPHENHYDRAMINE HCL 50 MG/ML IJ SOLN: 25.0000 mg | Freq: Four times a day (QID) | INTRAMUSCULAR | Status: DC | PRN

## 2021-10-30 MED ORDER — OXYCODONE HCL 5 MG PO TABS
5.0000 mg | ORAL_TABLET | ORAL | Status: DC | PRN
  Administered 2021-10-31 (×2): 10 mg via ORAL
  Filled 2021-10-30 (×2): qty 2

## 2021-10-30 MED ORDER — MELATONIN 3 MG PO TABS: 3.0000 mg | ORAL_TABLET | Freq: Every evening | ORAL | Status: DC | PRN

## 2021-10-30 MED ORDER — PIPERACILLIN-TAZOBACTAM 3.375 G IVPB 30 MIN
3.3750 g | Freq: Once | INTRAVENOUS | Status: AC
  Administered 2021-10-30: 3.375 g via INTRAVENOUS
  Filled 2021-10-30: qty 50

## 2021-10-30 MED ORDER — DIPHENHYDRAMINE HCL 25 MG PO CAPS: 25.0000 mg | ORAL_CAPSULE | Freq: Four times a day (QID) | ORAL | Status: DC | PRN

## 2021-10-30 MED ORDER — ONDANSETRON HCL 4 MG/2ML IJ SOLN
4.0000 mg | Freq: Once | INTRAMUSCULAR | Status: AC
  Administered 2021-10-30: 4 mg via INTRAVENOUS
  Filled 2021-10-30: qty 2

## 2021-10-30 MED ORDER — ENOXAPARIN SODIUM 40 MG/0.4ML IJ SOSY
40.0000 mg | PREFILLED_SYRINGE | INTRAMUSCULAR | Status: DC
  Administered 2021-10-30 – 2021-11-02 (×4): 40 mg via SUBCUTANEOUS
  Filled 2021-10-30 (×4): qty 0.4

## 2021-10-30 MED ORDER — INFLUENZA VAC SPLIT QUAD 0.5 ML IM SUSY
0.5000 mL | PREFILLED_SYRINGE | INTRAMUSCULAR | Status: AC
  Administered 2021-10-31: 0.5 mL via INTRAMUSCULAR
  Filled 2021-10-30 (×2): qty 0.5

## 2021-10-30 MED ORDER — ONDANSETRON 4 MG PO TBDP: 4.0000 mg | ORAL_TABLET | Freq: Four times a day (QID) | ORAL | Status: DC | PRN

## 2021-10-30 MED ORDER — METOPROLOL TARTRATE 5 MG/5ML IV SOLN: 5.0000 mg | Freq: Four times a day (QID) | INTRAVENOUS | Status: DC | PRN

## 2021-10-30 MED ORDER — PIPERACILLIN-TAZOBACTAM 3.375 G IVPB
3.3750 g | Freq: Three times a day (TID) | INTRAVENOUS | Status: DC
Start: 2021-10-30 — End: 2021-11-03
  Administered 2021-10-30 – 2021-11-03 (×11): 3.375 g via INTRAVENOUS
  Filled 2021-10-30 (×11): qty 50

## 2021-10-30 NOTE — ED Provider Notes (Signed)
Emergency Medicine Provider Triage Evaluation Note  Amy [name] , a 60 y.o. female  was evaluated in triage.  Pt complains of abdominal pain for the past 2 weeks.  Localizes it to the left lower quadrant.  Denies nausea, vomiting, diarrhea.  No history of diverticulosis.  Review of Systems  Positive: Abdominal pain Negative: NVD  Physical Exam  BP (!) 152/108 (BP Location: Right Arm)   Pulse (!) 108   Temp 98.4 F (36.9 C) (Oral)   Resp 18   SpO2 100%  Gen:   Awake, no distress   Resp:  Normal effort  MSK:   Moves extremities without difficulty  Other:  LLQ tenderness  Medical Decision Making  Medically screening exam initiated at 9:30 AM.  Appropriate orders placed.  Amy [name] was informed that the remainder of the evaluation will be completed by another provider, this initial triage assessment does not replace that evaluation, and the importance of remaining in the ED until their evaluation is complete.     Rhae Hammock, PA-C 10/30/21 3241    [name], MD 10/30/21 1351

## 2021-10-30 NOTE — ED Triage Notes (Signed)
Pt. Stated, Amy [name] had this abdominal pain more on the left side for 2 weeks.

## 2021-10-30 NOTE — Progress Notes (Signed)
Pharmacy Antibiotic Note  Amy [name] is a 60 y.o. female admitted on 10/30/2021 with diverticulitis with abscess. Patient is afebrile and WBC 13.6. Pharmacy has been consulted for piperacillin-tazobactam dosing.   Plan: Zosyn 3.375g q8h F/u renal function, length of therapy, and narrow  Recent Labs  Lab 10/30/21 0857  WBC 13.6*  CREATININE 1.00    Estimate CrCl 36 mL/min  No Known Allergies  Antimicrobials this admission: Zosyn 11/8 >  Microbiology results:  Thank you for allowing pharmacy to be a part of this patient's care.  Levonne Spiller 10/30/2021 3:18 PM

## 2021-10-30 NOTE — H&P (Signed)
Amy [name] March 07, 1961  [id number].    Chief Complaint/Reason for Consult: diverticulitis with abscess  HPI:  This is a 60 yo health female with a history of anemia and fibroids who has been having about 2 weeks of LLQ abdominal pain.  This has been progressively getting worse, especially with movement.  She denies any activities that may have strained a muscles.  She tried to soak in a warm bath but to no avail.  She has not had an N/V/D, constipation, or fevers.  She denies blood in her stool. She has never had a colonoscopy.  Last night her pain got significantly worse and was unable to sleep.  She has tried Ibuprofen and Tylenol which helps some but not for very long.  She presented to the ED for further evaluation.  She underwent a CT scan that revealed sigmoid diverticulitis with a 3.6x3.6cm rim enhancing fluid collection.  She had fullness in her myometrium c/w known fibroid disease.  Her WBC was 13.6 and all other labs were unremarkable.  We have been asked to see for further evaluation and admission.  Previous surgical history includes cesarean section She is a current every day smoker but is motivated to quit. Occasional alcohol use NKDA  ROS: Review of Systems  Constitutional:  Negative for chills and fever.  Respiratory:  Negative for cough, shortness of breath and wheezing.   Cardiovascular:  Negative for chest pain, palpitations and leg swelling.  Gastrointestinal:  Positive for abdominal pain. Negative for blood in stool, constipation, diarrhea, nausea and vomiting.  Genitourinary: Negative.    History reviewed. No pertinent family history.  Past Medical History:  Diagnosis Date   Anemia    Fibroids    Headache(784.0)    "mostly related to weather changing"   Pneumonia 06/30/12   Syphilis 2-3 years ago   tx and resolved   Trichomonas 2 years ago   tx and resolved    Past Surgical History:  Procedure Laterality Date   [place]  CYST EXCISION  05/2012   right wrist   TONSILLECTOMY     "when I was a little girl"    Social History:  reports that she quit smoking about 9 years ago. Her smoking use included cigarettes. She has a 16.00 pack-year smoking history. She has never used smokeless tobacco. She reports current alcohol use of about 2.0 standard drinks per week. She reports that she does not use drugs.  Allergies: No Known Allergies  (Not in a hospital admission)    Physical Exam: Blood pressure 119/69, pulse 82, temperature 98.4 F (36.9 C), temperature source Oral, resp. rate (!) 25, SpO2 95 %. General: pleasant, WD, WN female who is laying in bed in NAD HEENT: head is normocephalic, atraumatic.  Sclera are noninjected.  Pupils equal and round.  Ears and nose without any masses or lesions.  Mouth is pink and moist Heart: regular, rate, and rhythm.  Normal s1,s2. No obvious murmurs, gallops, or rubs noted.  Palpable radial and pedal pulses bilaterally Lungs: CTAB, no wheezes, rhonchi, or rales noted.  Respiratory effort nonlabored Abd: soft, moderately tender in LLQ with some voluntary guarding but no peritonitis or rebounding, ND, +BS, no masses, hernias, or organomegaly MS: all 4 extremities are symmetrical with no cyanosis, clubbing, or edema. Skin: warm and dry with no masses, lesions, or rashes Neuro: Cranial nerves 2-12 grossly intact, sensation is normal throughout Psych: A&Ox3 with an appropriate affect.   Results  for orders placed or performed during the hospital encounter of 10/30/21 (from the past 48 hour(s))  Urinalysis, Routine w reflex microscopic     Status: Abnormal   Collection Time: 10/30/21  8:52 AM  Result Value Ref Range   Color, Urine AMBER (A) YELLOW    Comment: BIOCHEMICALS MAY BE AFFECTED BY COLOR   APPearance CLOUDY (A) CLEAR   Specific Gravity, Urine 1.023 1.005 - 1.030   pH 6.0 5.0 - 8.0   Glucose, UA NEGATIVE NEGATIVE mg/dL   Hgb urine dipstick NEGATIVE NEGATIVE    Bilirubin Urine NEGATIVE NEGATIVE   Ketones, ur NEGATIVE NEGATIVE mg/dL   Protein, ur NEGATIVE NEGATIVE mg/dL   Nitrite NEGATIVE NEGATIVE   Leukocytes,Ua NEGATIVE NEGATIVE    Comment: Performed at [facility] [address] [place], [place] [address]  CBC     Status: Abnormal   Collection Time: 10/30/21  8:57 AM  Result Value Ref Range   WBC 13.6 (H) 4.0 - 10.5 K/uL   RBC 4.24 3.87 - 5.11 MIL/uL   Hemoglobin 14.2 12.0 - 15.0 g/dL   HCT 42.8 36.0 - 46.0 %   MCV 100.9 (H) 80.0 - 100.0 fL   MCH 33.5 26.0 - 34.0 pg   MCHC 33.2 30.0 - 36.0 g/dL   RDW 12.2 11.5 - 15.5 %   Platelets 377 150 - 400 K/uL   nRBC 0.0 0.0 - 0.2 %    Comment: Performed at [facility], [place] [address], [place], [place] [address]  Lipase, blood     Status: None   Collection Time: 10/30/21  8:57 AM  Result Value Ref Range   Lipase 24 11 - 51 U/L    Comment: Performed at [facility] [address] [place], [place] [address]  Comprehensive metabolic panel     Status: Abnormal   Collection Time: 10/30/21  8:57 AM  Result Value Ref Range   Sodium 138 135 - 145 mmol/L   Potassium 4.1 3.5 - 5.1 mmol/L   Chloride 102 98 - 111 mmol/L   CO2 25 22 - 32 mmol/L   Glucose, Bld 122 (H) 70 - 99 mg/dL    Comment: Glucose reference range applies only to samples taken after fasting for at least 8 hours.   BUN 8 6 - 20 mg/dL   Creatinine, Ser 1.00 0.44 - 1.00 mg/dL   Calcium 9.7 8.9 - 10.3 mg/dL   Total Protein 7.7 6.5 - 8.1 g/dL   Albumin 3.7 3.5 - 5.0 g/dL   AST 14 (L) 15 - 41 U/L   ALT 11 0 - 44 U/L   Alkaline Phosphatase 63 38 - 126 U/L   Total Bilirubin 0.8 0.3 - 1.2 mg/dL   GFR, Estimated >60 >60 mL/min    Comment: (NOTE) Calculated using the CKD-EPI Creatinine Equation (2021)    Anion gap 11 5 - 15    Comment: Performed at [facility] [address] [place], [place] [address]   CT ABDOMEN PELVIS W CONTRAST  Result Date: 10/30/2021 CLINICAL DATA:  Abdominal pain on the left for  2 weeks. EXAM: CT ABDOMEN AND PELVIS WITH CONTRAST TECHNIQUE: Multidetector CT imaging of the abdomen and pelvis was performed using the standard protocol following bolus administration of intravenous contrast. CONTRAST:  192mL OMNIPAQUE IOHEXOL 350 MG/ML SOLN COMPARISON:  03/03/2013 FINDINGS: Lower chest: Unremarkable Hepatobiliary: No suspicious focal abnormality within the liver parenchyma. Similar appearance gallbladder fundus, likely adenomyomatosis. No intrahepatic or extrahepatic  biliary dilation. Pancreas: No focal mass lesion. No dilatation of the main duct. No intraparenchymal cyst. No peripancreatic edema. Spleen: No splenomegaly. No focal mass lesion. Adrenals/Urinary Tract: No adrenal nodule or mass. Kidneys unremarkable. No evidence for hydroureter. The urinary bladder appears normal for the degree of distention. Stomach/Bowel: Tiny hiatal hernia. Stomach otherwise unremarkable. Duodenum is normally positioned as is the ligament of Treitz. No small bowel wall thickening. No small bowel dilatation. The terminal ileum is normal. The appendix is normal. Diverticular changes are noted in the left colon. There is no wall thickening in the proximal sigmoid colon with pericolonic edema/inflammation and a 3.6 x 3.6 cm rim enhancing pericolonic fluid collection compatible with abscess (image 59/3). Vascular/Lymphatic: There is mild atherosclerotic calcification of the abdominal aorta without aneurysm. There is no gastrohepatic or hepatoduodenal ligament lymphadenopathy. No retroperitoneal or mesenteric lymphadenopathy. No pelvic sidewall lymphadenopathy. Reproductive: Lobular uterine contour with distortion of the endometrial canal is suggestive of fibroid disease. There is no adnexal mass. Other: Trace intraperitoneal free fluid Musculoskeletal: No worrisome lytic or sclerotic osseous abnormality. IMPRESSION: 1. Sigmoid diverticulitis with pericolonic edema/inflammation and a 3.6 x 3.6 cm rim enhancing para  colonic fluid collection compatible with abscess. 2. Soft tissue fullness in the myometrium with distortion of the endometrial canal, likely related to uterine fibroid disease. Pelvic ultrasound recommended to confirm. 3. Tiny hiatal hernia. 4. Aortic Atherosclerosis (ICD10-I70.0). Electronically Signed   By: Misty Stanley M.D.   On: 10/30/2021 14:31      Assessment/Plan Diverticulitis with abscess The patient has been evaluated and her imaging reviewed.  She appears to have a decent size abscess near her colon in the LLQ.  We will admit the patient for bowel rest and IV abx therapy to see if we can get her better with conservative management.  If we are unable or she acutely worsens she may require surgical intervention, including a colostomy.   Discussed with IR and they do not recommend drain at this time unless patient acutely worsens and then could attempt at that time. Collection appears more phlegmonous at this time. Consider repeat CT in next few days pending progress to reassess We will keep her NPO for now and see how she progresses.  She will require GI follow up and colonoscopy in about 6 weeks once her diverticulitis resolves which I discussed with her today   FEN - NPO, IVFs VTE - lovenox ID - Zosyn Admit - inpatient  Winferd Humphrey, [facility] Surgery 10/30/2021, 3:43 PM Please see Amion for pager number during day hours 7:00am-4:30pm

## 2021-10-30 NOTE — ED Provider Notes (Signed)
Amy [name]   CSN: [id number] Arrival date & time: 10/30/21  0840     History Chief Complaint  Patient presents with   Abdominal Pain    Amy [name] is a 60 y.o. female.  Patient is a 60 year old female with a history of prior anemia and fibroids who is presenting today with 2-week history of gradually worsening left lower quadrant pain.  She initially thought that maybe she pulled something because she started working out but it is not improving.  It is worse with movement and activity but is also bad sometimes just at rest.  It has been gradually worsening and last night became so bad she could not sleep and had to lay in the bathtub and hot water.  She denies any nausea or vomiting.  She has had more stools than normal but denies diarrhea or blood in her stool.  No prior abdominal surgeries other than a C-section.  She denies any urinary symptoms.  No fevers.  No prior colonoscopy and no history of diverticulosis.  Ibuprofen or Tylenol does improve the pain for an hour or 2 but then it just comes back.  She does not think it is worse with eating but she has recently changed her diet.  The history is provided by the patient.  Abdominal Pain     Past Medical History:  Diagnosis Date   Anemia    Fibroids    Headache(784.0)    "mostly related to weather changing"   Pneumonia 06/30/12   Syphilis 2-3 years ago   tx and resolved   Trichomonas 2 years ago   tx and resolved    Patient Active Problem List   Diagnosis Date Noted   Sigmoid diverticulitis 03/05/2013   Pneumonia, community acquired 06/30/2012    Past Surgical History:  Procedure Laterality Date   [place] CYST EXCISION  05/2012   right wrist   TONSILLECTOMY     "when I was a little girl"     OB History   No obstetric history on file.     No family history on file.  Social History   Tobacco Use   Smoking status: Former     Packs/day: 0.50    Years: 32.00    Pack years: 16.00    Types: Cigarettes    Quit date: 06/25/2012    Years since quitting: 9.3   Smokeless tobacco: Never  Substance Use Topics   Alcohol use: Yes    Alcohol/week: 2.0 standard drinks    Types: 2 Glasses of wine per week   Drug use: No    Home Medications Prior to Admission medications   Medication Sig Start Date End Date Taking? Authorizing Provider  cyclobenzaprine (FLEXERIL) 5 MG tablet Take 1-2 tablets (5-10 mg total) by mouth 3 (three) times daily as needed for muscle spasms. 04/19/16   Jaynee Eagles, PA-C  naproxen sodium (ANAPROX DS) 550 MG tablet Take 1 tablet (550 mg total) by mouth 2 (two) times daily with a meal. 04/19/16   Jaynee Eagles, PA-C    Allergies    Patient has no known allergies.  Review of Systems   Review of Systems  Gastrointestinal:  Positive for abdominal pain.  All other systems reviewed and are negative.  Physical Exam Updated Vital Signs BP (!) 152/108 (BP Location: Right Arm)   Pulse (!) 108   Temp 98.4 F (36.9 C) (Oral)   Resp 18  SpO2 100%   Physical Exam Vitals and nursing [name] reviewed.  Constitutional:      General: She is not in acute distress.    Appearance: She is well-developed.  HENT:     Head: Normocephalic and atraumatic.  Eyes:     Pupils: Pupils are equal, round, and reactive to light.  Cardiovascular:     Rate and Rhythm: Regular rhythm. Tachycardia present.     Heart sounds: Normal heart sounds. No murmur heard.   No friction rub.  Pulmonary:     Effort: Pulmonary effort is normal.     Breath sounds: Normal breath sounds. No wheezing or rales.  Abdominal:     General: Bowel sounds are normal. There is no distension.     Palpations: Abdomen is soft.     Tenderness: There is abdominal tenderness in the left lower quadrant. There is guarding. There is no left CVA tenderness or rebound.     Hernia: No hernia is present.  Musculoskeletal:        General: No tenderness.  Normal range of motion.     Comments: No edema  Skin:    General: Skin is warm and dry.     Findings: No rash.  Neurological:     Mental Status: She is alert and oriented to person, place, and time.     Cranial Nerves: No cranial nerve deficit.  Psychiatric:        Behavior: Behavior normal.    ED Results / Procedures / Treatments   Labs (all labs ordered are listed, but only abnormal results are displayed) Labs Reviewed  CBC - Abnormal; Notable for the following components:      Result Value   WBC 13.6 (*)    MCV 100.9 (*)    All other components within normal limits  COMPREHENSIVE METABOLIC PANEL - Abnormal; Notable for the following components:   Glucose, Bld 122 (*)    AST 14 (*)    All other components within normal limits  LIPASE, BLOOD  URINALYSIS, ROUTINE W REFLEX MICROSCOPIC    EKG None  Radiology CT ABDOMEN PELVIS W CONTRAST  Result Date: 10/30/2021 CLINICAL DATA:  Abdominal pain on the left for 2 weeks. EXAM: CT ABDOMEN AND PELVIS WITH CONTRAST TECHNIQUE: Multidetector CT imaging of the abdomen and pelvis was performed using the standard protocol following bolus administration of intravenous contrast. CONTRAST:  175mL OMNIPAQUE IOHEXOL 350 MG/ML SOLN COMPARISON:  03/03/2013 FINDINGS: Lower chest: Unremarkable Hepatobiliary: No suspicious focal abnormality within the liver parenchyma. Similar appearance gallbladder fundus, likely adenomyomatosis. No intrahepatic or extrahepatic biliary dilation. Pancreas: No focal mass lesion. No dilatation of the main duct. No intraparenchymal cyst. No peripancreatic edema. Spleen: No splenomegaly. No focal mass lesion. Adrenals/Urinary Tract: No adrenal nodule or mass. Kidneys unremarkable. No evidence for hydroureter. The urinary bladder appears normal for the degree of distention. Stomach/Bowel: Tiny hiatal hernia. Stomach otherwise unremarkable. Duodenum is normally positioned as is the ligament of Treitz. No small bowel wall  thickening. No small bowel dilatation. The terminal ileum is normal. The appendix is normal. Diverticular changes are noted in the left colon. There is no wall thickening in the proximal sigmoid colon with pericolonic edema/inflammation and a 3.6 x 3.6 cm rim enhancing pericolonic fluid collection compatible with abscess (image 59/3). Vascular/Lymphatic: There is mild atherosclerotic calcification of the abdominal aorta without aneurysm. There is no gastrohepatic or hepatoduodenal ligament lymphadenopathy. No retroperitoneal or mesenteric lymphadenopathy. No pelvic sidewall lymphadenopathy. Reproductive: Lobular uterine contour with distortion of the  endometrial canal is suggestive of fibroid disease. There is no adnexal mass. Other: Trace intraperitoneal free fluid Musculoskeletal: No worrisome lytic or sclerotic osseous abnormality. IMPRESSION: 1. Sigmoid diverticulitis with pericolonic edema/inflammation and a 3.6 x 3.6 cm rim enhancing para colonic fluid collection compatible with abscess. 2. Soft tissue fullness in the myometrium with distortion of the endometrial canal, likely related to uterine fibroid disease. Pelvic ultrasound recommended to confirm. 3. Tiny hiatal hernia. 4. Aortic Atherosclerosis (ICD10-I70.0). Electronically Signed   By: Misty Stanley M.D.   On: 10/30/2021 14:31    Procedures Procedures   Medications Ordered in ED Medications  lactated ringers bolus 1,000 mL (has no administration in time range)  morphine 4 MG/ML injection 4 mg (has no administration in time range)  ondansetron (ZOFRAN) injection 4 mg (has no administration in time range)    ED Course  I have reviewed the triage vital signs and the nursing notes.  Pertinent labs & imaging results that were available during my care of the patient were reviewed by me and considered in my medical decision making (see chart for details).    MDM Rules/Calculators/A&P                           60 year old female  presenting today with abdominal pain in the left lower quadrant.  Concern for diverticulitis versus ovarian pathology versus renal stone.  Low suspicion for zoster, lower suspicion for MSK pain, no evidence of hernias and low suspicion for obstruction.  Patient has mild leukocytosis of 13 with normal hemoglobin, lipase and CMP.  UA is still pending and patient will need imaging for further evaluation.  She was given pain control.  Otherwise well-appearing at this time.  3:07 PM CT is consistent with diverticulitis and forming abscess 3.6 x 3.6 cm.  Patient given Zosyn.  General surgery to evaluate and admit.  MDM   Amount and/or Complexity of Data Reviewed Clinical lab tests: ordered and reviewed Tests in the radiology section of CPT: ordered and reviewed Independent visualization of images, tracings, or specimens: yes     Final Clinical Impression(s) / ED Diagnoses Final diagnoses:  Diverticulitis of large intestine with abscess without bleeding    Rx / DC Orders ED Discharge Orders     None        Blanchie Dessert, MD 10/30/21 [phone]

## 2021-10-31 LAB — CBC
HCT: 37.7 % (ref 36.0–46.0)
Hemoglobin: 12.4 g/dL (ref 12.0–15.0)
MCH: 33.3 pg (ref 26.0–34.0)
MCHC: 32.9 g/dL (ref 30.0–36.0)
MCV: 101.3 fL — ABNORMAL HIGH (ref 80.0–100.0)
Platelets: 306 10*3/uL (ref 150–400)
RBC: 3.72 MIL/uL — ABNORMAL LOW (ref 3.87–5.11)
RDW: 12.1 % (ref 11.5–15.5)
WBC: 11.3 10*3/uL — ABNORMAL HIGH (ref 4.0–10.5)
nRBC: 0 % (ref 0.0–0.2)

## 2021-10-31 LAB — BASIC METABOLIC PANEL
Anion gap: 8 (ref 5–15)
BUN: 5 mg/dL — ABNORMAL LOW (ref 6–20)
CO2: 24 mmol/L (ref 22–32)
Calcium: 8.4 mg/dL — ABNORMAL LOW (ref 8.9–10.3)
Chloride: 104 mmol/L (ref 98–111)
Creatinine, Ser: 1.03 mg/dL — ABNORMAL HIGH (ref 0.44–1.00)
GFR, Estimated: >60 mL/min (ref >60)
Glucose, Bld: 126 mg/dL — ABNORMAL HIGH (ref 70–99)
Potassium: 4 mmol/L (ref 3.5–5.1)
Sodium: 136 mmol/L (ref 135–145)

## 2021-10-31 NOTE — Progress Notes (Signed)
Progress Note     Subjective: Pain improved this am. No nausea or emesis. Ambulating. No other complaints   Objective: Vital signs in last 24 hours: Temp:  [98.3 F (36.8 C)-100.1 F (37.8 C)] 98.3 F (36.8 C) (11/09 0735) Pulse Rate:  [69-108] 71 (11/09 0735) Resp:  [14-25] 16 (11/09 0735) BP: (91-152)/(61-110) 109/71 (11/09 0735) SpO2:  [92 %-100 %] 99 % (11/09 0735) Weight:  [82.2 kg] 82.2 kg (11/08 2057) Last BM Date: 10/30/21  Intake/Output from previous day: 11/08 0701 - 11/09 0700 In: 2079.8 [I.V.:1044.5; IV Piggyback:1035.2] Out: -  Intake/Output this shift: No intake/output data recorded.  PE: General: pleasant, WD, female who is laying in bed in NAD HEENT: head is normocephalic, atraumatic. Mouth is pink and moist Heart: regular, rate, and rhythm.  Palpable radial pulses bilaterally Lungs: CTAB, no wheezes, rhonchi, or rales noted.  Respiratory effort nonlabored Abd: soft, ND, +BS, mild to moderate TTP in LLQ without rebound or guarding  MSK: all 4 extremities are symmetrical with no cyanosis, clubbing, or edema. Skin: warm and dry with no masses, lesions, or rashes Psych: A&Ox3 with an appropriate affect.    Lab Results:  Recent Labs    10/30/21 0857 10/31/21 0137  WBC 13.6* 11.3*  HGB 14.2 12.4  HCT 42.8 37.7  PLT 377 306   BMET Recent Labs    10/30/21 0857 10/31/21 0137  NA 138 136  K 4.1 4.0  CL 102 104  CO2 25 24  GLUCOSE 122* 126*  BUN 8 5*  CREATININE 1.00 1.03*  CALCIUM 9.7 8.4*   PT/INR No results for input(s): LABPROT, INR in the last 72 hours. CMP     Component Value Date/Time   NA 136 10/31/2021 0137   K 4.0 10/31/2021 0137   CL 104 10/31/2021 0137   CO2 24 10/31/2021 0137   GLUCOSE 126 (H) 10/31/2021 0137   BUN 5 (L) 10/31/2021 0137   CREATININE 1.03 (H) 10/31/2021 0137   CALCIUM 8.4 (L) 10/31/2021 0137   PROT 7.7 10/30/2021 0857   ALBUMIN 3.7 10/30/2021 0857   AST 14 (L) 10/30/2021 0857   ALT 11 10/30/2021 0857    ALKPHOS 63 10/30/2021 0857   BILITOT 0.8 10/30/2021 0857   GFRNONAA >60 10/31/2021 0137   GFRAA 81 (L) 06/30/2012 1640   Lipase     Component Value Date/Time   LIPASE 24 10/30/2021 0857       Studies/Results: CT ABDOMEN PELVIS W CONTRAST  Result Date: 10/30/2021 CLINICAL DATA:  Abdominal pain on the left for 2 weeks. EXAM: CT ABDOMEN AND PELVIS WITH CONTRAST TECHNIQUE: Multidetector CT imaging of the abdomen and pelvis was performed using the standard protocol following bolus administration of intravenous contrast. CONTRAST:  1108mL OMNIPAQUE IOHEXOL 350 MG/ML SOLN COMPARISON:  03/03/2013 FINDINGS: Lower chest: Unremarkable Hepatobiliary: No suspicious focal abnormality within the liver parenchyma. Similar appearance gallbladder fundus, likely adenomyomatosis. No intrahepatic or extrahepatic biliary dilation. Pancreas: No focal mass lesion. No dilatation of the main duct. No intraparenchymal cyst. No peripancreatic edema. Spleen: No splenomegaly. No focal mass lesion. Adrenals/Urinary Tract: No adrenal nodule or mass. Kidneys unremarkable. No evidence for hydroureter. The urinary bladder appears normal for the degree of distention. Stomach/Bowel: Tiny hiatal hernia. Stomach otherwise unremarkable. Duodenum is normally positioned as is the ligament of Treitz. No small bowel wall thickening. No small bowel dilatation. The terminal ileum is normal. The appendix is normal. Diverticular changes are noted in the left colon. There is no wall thickening in the  proximal sigmoid colon with pericolonic edema/inflammation and a 3.6 x 3.6 cm rim enhancing pericolonic fluid collection compatible with abscess (image 59/3). Vascular/Lymphatic: There is mild atherosclerotic calcification of the abdominal aorta without aneurysm. There is no gastrohepatic or hepatoduodenal ligament lymphadenopathy. No retroperitoneal or mesenteric lymphadenopathy. No pelvic sidewall lymphadenopathy. Reproductive: Lobular uterine  contour with distortion of the endometrial canal is suggestive of fibroid disease. There is no adnexal mass. Other: Trace intraperitoneal free fluid Musculoskeletal: No worrisome lytic or sclerotic osseous abnormality. IMPRESSION: 1. Sigmoid diverticulitis with pericolonic edema/inflammation and a 3.6 x 3.6 cm rim enhancing para colonic fluid collection compatible with abscess. 2. Soft tissue fullness in the myometrium with distortion of the endometrial canal, likely related to uterine fibroid disease. Pelvic ultrasound recommended to confirm. 3. Tiny hiatal hernia. 4. Aortic Atherosclerosis (ICD10-I70.0). Electronically Signed   By: Misty Stanley M.D.   On: 10/30/2021 14:31    Anti-infectives: Anti-infectives (From admission, onward)    Start     Dose/Rate Route Frequency Ordered Stop   10/30/21 2300  piperacillin-tazobactam (ZOSYN) IVPB 3.375 g        3.375 g 12.5 mL/hr over 240 Minutes Intravenous Every 8 hours 10/30/21 1601     10/30/21 1515  piperacillin-tazobactam (ZOSYN) IVPB 3.375 g        3.375 g 100 mL/hr over 30 Minutes Intravenous  Once 10/30/21 1507 10/30/21 1718        Assessment/Plan  Diverticulitis with abscess - CT with decent size abscess near her colon in the LLQ. - Discussed with IR and they do not recommend drain at this time unless patient acutely worsens and then could attempt. Collection appears more phlegmonous at this time.  - Consider repeat CT in next few days pending progress to reassess - WBC 11.3 (13.6), afebrile - continue IV abx therapy. Start clears today - hopefully she improves with conservative management. If no improvement or she acutely worsens she may require surgical intervention, including a colostomy.    - She will require GI follow up and colonoscopy in about 6 weeks once her diverticulitis resolves     FEN - clears, dec IVF VTE - lovenox ID - Zosyn 11/8 >>    LOS: 1 day    Winferd Humphrey, [facility] Surgery 10/31/2021,  8:04 AM Please see Amion for pager number during day hours 7:00am-4:30pm

## 2021-10-31 NOTE — Progress Notes (Signed)
Mobility Specialist Progress Note:   10/31/21 1640  Mobility  Activity Ambulated in room  Level of Assistance Independent  Assistive Device None  Distance Ambulated (ft) 50 ft  Mobility Ambulated independently in room  Mobility Response Tolerated well  Mobility performed by Mobility specialist  $Mobility charge 1 Mobility   Second mobility session today. Pt asx during ambulation.   Nelta Numbers Mobility Specialist  Phone [phone]

## 2021-10-31 NOTE — Progress Notes (Signed)
Mobility Specialist Progress Note:   10/31/21 1145  Mobility  Activity Ambulated in room  Level of Assistance Independent  Assistive Device None  Distance Ambulated (ft) 50 ft  Mobility Ambulated independently in room  Mobility Response Tolerated well  Mobility performed by Mobility specialist  $Mobility charge 1 Mobility   Pt asx during ambulation. C/o feeling generalized weakness d/t being in bed. Will see again today if schedule permits.   Nelta Numbers Mobility Specialist  Phone [phone]

## 2021-11-01 LAB — BASIC METABOLIC PANEL
Anion gap: 6 (ref 5–15)
BUN: <5 mg/dL — ABNORMAL LOW (ref 6–20)
CO2: 23 mmol/L (ref 22–32)
Calcium: 8.3 mg/dL — ABNORMAL LOW (ref 8.9–10.3)
Chloride: 106 mmol/L (ref 98–111)
Creatinine, Ser: 0.74 mg/dL (ref 0.44–1.00)
GFR, Estimated: >60 mL/min (ref >60)
Glucose, Bld: 111 mg/dL — ABNORMAL HIGH (ref 70–99)
Potassium: 3.6 mmol/L (ref 3.5–5.1)
Sodium: 135 mmol/L (ref 135–145)

## 2021-11-01 LAB — CBC
HCT: 36.9 % (ref 36.0–46.0)
Hemoglobin: 12.1 g/dL (ref 12.0–15.0)
MCH: 33.2 pg (ref 26.0–34.0)
MCHC: 32.8 g/dL (ref 30.0–36.0)
MCV: 101.1 fL — ABNORMAL HIGH (ref 80.0–100.0)
Platelets: 292 10*3/uL (ref 150–400)
RBC: 3.65 MIL/uL — ABNORMAL LOW (ref 3.87–5.11)
RDW: 11.9 % (ref 11.5–15.5)
WBC: 10.3 10*3/uL (ref 4.0–10.5)
nRBC: 0 % (ref 0.0–0.2)

## 2021-11-01 NOTE — Progress Notes (Signed)
Mobility Specialist Progress Note:   11/01/21 1110  Mobility  Activity Ambulated in hall  Level of Assistance Independent  Assistive Device None  Distance Ambulated (ft) 560 ft  Mobility Ambulated independently in hallway  Mobility Response Tolerated well  Mobility performed by Mobility specialist  $Mobility charge 1 Mobility   Pt asx during ambulation. Encouraged another walk today.  Nelta Numbers Mobility Specialist  Phone [phone]

## 2021-11-01 NOTE — Progress Notes (Signed)
Mobility Specialist Progress Note:   11/01/21 1600  Mobility  Activity Ambulated in hall  Level of Assistance Independent  Assistive Device None  Distance Ambulated (ft) 560 ft  Mobility Ambulated independently in hallway  Mobility Response Tolerated well  Mobility performed by Mobility specialist  $Mobility charge 1 Mobility   Second mobility session today. Pt asx during ambulation.   Nelta Numbers Mobility Specialist  Phone [phone]

## 2021-11-01 NOTE — Progress Notes (Signed)
Progress Note     Subjective: Pain continues to improve. Episode of emesis after eating a whole bowl of broth yesterday - she did not have any further liquid meals after that. Did have several medications around that time as well. Some mild nausea overnight as well but none so far this am. Ambulating. No other complaints  Objective: Vital signs in last 24 hours: Temp:  [98.3 F (36.8 C)-98.9 F (37.2 C)] 98.3 F (36.8 C) (11/10 0413) Pulse Rate:  [68-74] 70 (11/10 0413) Resp:  [16-17] 17 (11/10 0413) BP: (101-126)/(68-82) 101/82 (11/10 0413) SpO2:  [97 %-99 %] 98 % (11/10 0413) Last BM Date: 10/30/21  Intake/Output from previous day: 11/09 0701 - 11/10 0700 In: 2985.9 [P.O.:120; I.V.:2751.1; IV Piggyback:114.8] Out: -  Intake/Output this shift: No intake/output data recorded.  PE: General: pleasant, WD, female who is laying in bed in NAD HEENT: head is normocephalic, atraumatic. Mouth is pink and moist Heart: regular, rate, and rhythm.  Palpable radial pulses bilaterally Lungs: CTAB, no wheezes, rhonchi, or rales noted.  Respiratory effort nonlabored Abd: soft, ND, +BS, very mild TTP in LLQ without rebound or guarding  MSK: all 4 extremities are symmetrical with no cyanosis, clubbing, or edema. No calf TTP bilaterally Skin: warm and dry with no masses, lesions, or rashes Psych: A&Ox3 with an appropriate affect.    Lab Results:  Recent Labs    10/31/21 0137 11/01/21 0056  WBC 11.3* 10.3  HGB 12.4 12.1  HCT 37.7 36.9  PLT 306 292    BMET Recent Labs    10/31/21 0137 11/01/21 0056  NA 136 135  K 4.0 3.6  CL 104 106  CO2 24 23  GLUCOSE 126* 111*  BUN 5* <5*  CREATININE 1.03* 0.74  CALCIUM 8.4* 8.3*    PT/INR No results for input(s): LABPROT, INR in the last 72 hours. CMP     Component Value Date/Time   NA 135 11/01/2021 0056   K 3.6 11/01/2021 0056   CL 106 11/01/2021 0056   CO2 23 11/01/2021 0056   GLUCOSE 111 (H) 11/01/2021 0056   BUN <5 (L)  11/01/2021 0056   CREATININE 0.74 11/01/2021 0056   CALCIUM 8.3 (L) 11/01/2021 0056   PROT 7.7 10/30/2021 0857   ALBUMIN 3.7 10/30/2021 0857   AST 14 (L) 10/30/2021 0857   ALT 11 10/30/2021 0857   ALKPHOS 63 10/30/2021 0857   BILITOT 0.8 10/30/2021 0857   GFRNONAA >60 11/01/2021 0056   GFRAA 81 (L) 06/30/2012 1640   Lipase     Component Value Date/Time   LIPASE 24 10/30/2021 0857       Studies/Results: CT ABDOMEN PELVIS W CONTRAST  Result Date: 10/30/2021 CLINICAL DATA:  Abdominal pain on the left for 2 weeks. EXAM: CT ABDOMEN AND PELVIS WITH CONTRAST TECHNIQUE: Multidetector CT imaging of the abdomen and pelvis was performed using the standard protocol following bolus administration of intravenous contrast. CONTRAST:  113mL OMNIPAQUE IOHEXOL 350 MG/ML SOLN COMPARISON:  03/03/2013 FINDINGS: Lower chest: Unremarkable Hepatobiliary: No suspicious focal abnormality within the liver parenchyma. Similar appearance gallbladder fundus, likely adenomyomatosis. No intrahepatic or extrahepatic biliary dilation. Pancreas: No focal mass lesion. No dilatation of the main duct. No intraparenchymal cyst. No peripancreatic edema. Spleen: No splenomegaly. No focal mass lesion. Adrenals/Urinary Tract: No adrenal nodule or mass. Kidneys unremarkable. No evidence for hydroureter. The urinary bladder appears normal for the degree of distention. Stomach/Bowel: Tiny hiatal hernia. Stomach otherwise unremarkable. Duodenum is normally positioned as is the ligament  of Treitz. No small bowel wall thickening. No small bowel dilatation. The terminal ileum is normal. The appendix is normal. Diverticular changes are noted in the left colon. There is no wall thickening in the proximal sigmoid colon with pericolonic edema/inflammation and a 3.6 x 3.6 cm rim enhancing pericolonic fluid collection compatible with abscess (image 59/3). Vascular/Lymphatic: There is mild atherosclerotic calcification of the abdominal aorta  without aneurysm. There is no gastrohepatic or hepatoduodenal ligament lymphadenopathy. No retroperitoneal or mesenteric lymphadenopathy. No pelvic sidewall lymphadenopathy. Reproductive: Lobular uterine contour with distortion of the endometrial canal is suggestive of fibroid disease. There is no adnexal mass. Other: Trace intraperitoneal free fluid Musculoskeletal: No worrisome lytic or sclerotic osseous abnormality. IMPRESSION: 1. Sigmoid diverticulitis with pericolonic edema/inflammation and a 3.6 x 3.6 cm rim enhancing para colonic fluid collection compatible with abscess. 2. Soft tissue fullness in the myometrium with distortion of the endometrial canal, likely related to uterine fibroid disease. Pelvic ultrasound recommended to confirm. 3. Tiny hiatal hernia. 4. Aortic Atherosclerosis (ICD10-I70.0). Electronically Signed   By: Misty Stanley M.D.   On: 10/30/2021 14:31    Anti-infectives: Anti-infectives (From admission, onward)    Start     Dose/Rate Route Frequency Ordered Stop   10/30/21 2300  piperacillin-tazobactam (ZOSYN) IVPB 3.375 g        3.375 g 12.5 mL/hr over 240 Minutes Intravenous Every 8 hours 10/30/21 1601     10/30/21 1515  piperacillin-tazobactam (ZOSYN) IVPB 3.375 g        3.375 g 100 mL/hr over 30 Minutes Intravenous  Once 10/30/21 1507 10/30/21 1718        Assessment/Plan  Diverticulitis with abscess - CT with decent size abscess near her colon in the LLQ. - Discussed with IR and they did not recommend drain at time of presentation unless patient acutely worsens and then could attempt. Collection appears more phlegmonous  - Consider repeat CT in next few days pending progress to reassess - WBC 10.3 (11.3), afebrile, continue IV abx therapy - episode of emesis with clears yesterday. Continue clears this am and monitor - hopefully she improves with conservative management. If no improvement or she acutely worsens she may require surgical intervention, including a  colostomy.    - She will require GI follow up and colonoscopy in about 6 weeks once her diverticulitis resolves     FEN - clears, IVF VTE - lovenox ID - Zosyn 11/8 >>    LOS: 2 days    Winferd Humphrey, [facility] 11/01/2021, 7:34 AM Please see Amion for pager number during day hours 7:00am-4:30pm

## 2021-11-02 ENCOUNTER — Other Ambulatory Visit: Payer: Self-pay

## 2021-11-02 NOTE — TOC Initial Note (Addendum)
Transition of Care [facility]) - Initial/Assessment Note    Patient Details  Name: Amy [name] MRN: [id number] Date of Birth: 18-Apr-1961  [facility]) CM/SW Contact:    Marilu Favre, RN Phone Number: 11/02/2021, 11:34 AM  Clinical Narrative:                  Spoke to patient at bedside. PAtient does not have a PCP. Patient agreed for NCM to call [facility] and schedule an appointment. First available is December 21 , 2022 at 2:30 pm. Information placed on AVS.   Patient does not have insurance. Discussed MATCH program, patient in agreement and can afford MATCH co pays.   Patient states she has transportation to appointments.  Expected Discharge Plan: Home/Self Care     Patient Goals and CMS Choice Patient states their goals for this hospitalization and ongoing recovery are:: to return to home CMS Medicare.gov Compare Post Acute Care list provided to:: Patient Choice offered to / list presented to : Patient  Expected Discharge Plan and Services Expected Discharge Plan: Home/Self Care In-house Referral: Financial Counselor Discharge Planning Services: CM Consult, [facility], Sisters, Medication Assistance   Living arrangements for the past 2 months: Apartment                 DME Arranged: N/A         HH Arranged: NA          Prior Living Arrangements/Services Living arrangements for the past 2 months: Apartment Lives with:: Self Patient language and need for interpreter reviewed:: Yes Do you feel safe going back to the place where you live?: Yes      Need for Family Participation in Patient Care: No (Comment) Care giver support system in place?: Yes (comment)   Criminal Activity/Legal Involvement Pertinent to Current Situation/Hospitalization: No - Comment as needed  Activities of Daily Living Home Assistive Devices/Equipment: None ADL Screening (condition at time of admission) Patient's cognitive ability  adequate to safely complete daily activities?: Yes Is the patient deaf or have difficulty hearing?: No Does the patient have difficulty seeing, even when wearing glasses/contacts?: No (contacts to left eye) Does the patient have difficulty concentrating, remembering, or making decisions?: No Patient able to express need for assistance with ADLs?: Yes Does the patient have difficulty dressing or bathing?: Yes Independently performs ADLs?: Yes (appropriate for developmental age) Does the patient have difficulty walking or climbing stairs?: Yes (abd  pain limiting her climbing upstairs) Weakness of Legs: None Weakness of Arms/Hands: None  Permission Sought/Granted   Permission granted to share information with : No              Emotional Assessment Appearance:: Appears stated age Attitude/Demeanor/Rapport: Engaged Affect (typically observed): Accepting Orientation: : Oriented to Self, Oriented to Place, Oriented to  Time, Oriented to Situation Alcohol / Substance Use: Not Applicable Psych Involvement: No (comment)  Admission diagnosis:  Colonic diverticular abscess [K57.20] Diverticulitis of large intestine with abscess without bleeding [K57.20] Patient Active Problem List   Diagnosis Date Noted   Colonic diverticular abscess 10/30/2021   Sigmoid diverticulitis 03/05/2013   Pneumonia, community acquired 06/30/2012   PCP:  Patient, No Pcp Per (Inactive) Pharmacy:   [id number] - [place], [place]  [address] Phone: [phone] Fax: [phone]  [facility] [address] [place] [address] Phone: [phone] Fax: [phone]     Social Determinants  of Health (SDOH) Interventions    Readmission Risk Interventions No flowsheet data found.

## 2021-11-02 NOTE — Progress Notes (Signed)
Mobility Specialist Progress Note:   11/02/21 1030  Mobility  Activity Ambulated in hall  Level of Assistance Independent  Assistive Device None  Distance Ambulated (ft) 560 ft  Mobility Ambulated independently in hallway  Mobility Response Tolerated well  Mobility performed by Mobility specialist  $Mobility charge 1 Mobility   Pt asx during ambulation. Will see again today if schedule permits.   Nelta Numbers Mobility Specialist  Phone [phone]

## 2021-11-02 NOTE — Progress Notes (Signed)
Mobility Specialist Progress Note:   11/02/21 1611  Mobility  Activity Ambulated in hall  Level of Assistance Independent  Assistive Device None  Distance Ambulated (ft) 560 ft  Mobility Ambulated independently in hallway  Mobility Response Tolerated well  Mobility performed by Mobility specialist  $Mobility charge 1 Mobility   Second mobility session today. Pt asx during ambulation. Eager for soft diet dinner.  Nelta Numbers Mobility Specialist  Phone [phone]

## 2021-11-02 NOTE — Progress Notes (Signed)
Progress Note     Subjective: No further nausea or emesis and she has taken in a good amount of clears. Pain continues to improve. Ambulating. No other complaints  Objective: Vital signs in last 24 hours: Temp:  [98 F (36.7 C)-98.5 F (36.9 C)] 98 F (36.7 C) (11/11 0730) Pulse Rate:  [60-77] 60 (11/11 0730) Resp:  [16-18] 18 (11/11 0730) BP: (113-129)/(71-101) 126/84 (11/11 0730) SpO2:  [96 %-100 %] 98 % (11/11 0730) Last BM Date: 10/30/21  Intake/Output from previous day: 11/10 0701 - 11/11 0700 In: 1938.1 [P.O.:940; I.V.:938.9; IV Piggyback:59.1] Out: -  Intake/Output this shift: No intake/output data recorded.  PE: General: pleasant, WD, female who is laying in bed in NAD HEENT: head is normocephalic, atraumatic. Mouth is pink and moist Heart: regular, rate, and rhythm.  Palpable radial pulses bilaterally Lungs: CTAB, no wheezes, rhonchi, or rales noted.  Respiratory effort nonlabored Abd: soft, ND, +BS, NT this am MSK: all 4 extremities are symmetrical with no cyanosis, clubbing, or edema. No calf TTP bilaterally Skin: warm and dry with no masses, lesions, or rashes Psych: A&Ox3 with an appropriate affect.    Lab Results:  Recent Labs    10/31/21 0137 11/01/21 0056  WBC 11.3* 10.3  HGB 12.4 12.1  HCT 37.7 36.9  PLT 306 292    BMET Recent Labs    10/31/21 0137 11/01/21 0056  NA 136 135  K 4.0 3.6  CL 104 106  CO2 24 23  GLUCOSE 126* 111*  BUN 5* <5*  CREATININE 1.03* 0.74  CALCIUM 8.4* 8.3*    PT/INR No results for input(s): LABPROT, INR in the last 72 hours. CMP     Component Value Date/Time   NA 135 11/01/2021 0056   K 3.6 11/01/2021 0056   CL 106 11/01/2021 0056   CO2 23 11/01/2021 0056   GLUCOSE 111 (H) 11/01/2021 0056   BUN <5 (L) 11/01/2021 0056   CREATININE 0.74 11/01/2021 0056   CALCIUM 8.3 (L) 11/01/2021 0056   PROT 7.7 10/30/2021 0857   ALBUMIN 3.7 10/30/2021 0857   AST 14 (L) 10/30/2021 0857   ALT 11 10/30/2021 0857    ALKPHOS 63 10/30/2021 0857   BILITOT 0.8 10/30/2021 0857   GFRNONAA >60 11/01/2021 0056   GFRAA 81 (L) 06/30/2012 1640   Lipase     Component Value Date/Time   LIPASE 24 10/30/2021 0857       Studies/Results: No results found.  Anti-infectives: Anti-infectives (From admission, onward)    Start     Dose/Rate Route Frequency Ordered Stop   10/30/21 2300  piperacillin-tazobactam (ZOSYN) IVPB 3.375 g        3.375 g 12.5 mL/hr over 240 Minutes Intravenous Every 8 hours 10/30/21 1601     10/30/21 1515  piperacillin-tazobactam (ZOSYN) IVPB 3.375 g        3.375 g 100 mL/hr over 30 Minutes Intravenous  Once 10/30/21 1507 10/30/21 1718        Assessment/Plan  Diverticulitis with abscess - CT with decent size abscess near her colon in the LLQ. - Discussed with IR and they did not recommend drain at time of presentation unless patient acutely worsens and then could attempt. Collection appears more phlegmonous  - Consider repeat CT in next few days pending progress to reassess - WBC normalized yesterday - no further emesis. Advance to fulls this am - hopefully she improves with conservative management. If no improvement or she acutely worsens she may require surgical intervention, including  a colostomy.    - She will require GI follow up and colonoscopy in about 6 weeks once her diverticulitis resolves    FEN - FLD, dec IVF VTE - lovenox ID - Zosyn 11/8 >>    LOS: 3 days    Winferd Humphrey, [facility] 11/02/2021, 8:03 AM Please see Amion for pager number during day hours 7:00am-4:30pm

## 2021-11-03 MED ORDER — AMOXICILLIN-POT CLAVULANATE 875-125 MG PO TABS: 1.0000 | ORAL_TABLET | Freq: Two times a day (BID) | ORAL | 0 refills | Status: AC

## 2021-11-03 NOTE — Progress Notes (Signed)
Pharmacy Antibiotic Note  Amy [name] is a 60 y.o. female admitted on 10/30/2021 with diverticulitis with abscess. Patient is afebrile and WBC 10.3. Pharmacy has been consulted for piperacillin-tazobactam dosing.   Plan: Continue Zosyn 3.375g q8h F/u renal function, length of therapy, and narrow as appropriate  Recent Labs  Lab 10/30/21 0857 10/31/21 0137 11/01/21 0056  WBC 13.6* 11.3* 10.3  CREATININE 1.00 1.03* 0.74     Estimate CrCl 36 mL/min  No Known Allergies  Antimicrobials this admission: Zosyn 11/8 >>  Microbiology results:  Thank you for allowing pharmacy to be a part of this patient's care.  Lestine Box, PharmD PGY2 Infectious Diseases Pharmacy Resident   Please check [email or web address] for unit-specific pharmacy phone numbers

## 2021-11-03 NOTE — Discharge Summary (Signed)
Physician Discharge Summary  Patient ID: Amy [name] MRN: [id number] DOB/AGE: 1961-05-01 60 y.o.  Admit date: 10/30/2021 Discharge date: 11/03/2021  Admission Diagnoses:  Discharge Diagnoses:  Active Problems:   Colonic diverticular abscess   Discharged Condition: good  Hospital Course: Pt did well after starting ABX and prompt resolution of pain. Her diet was advanced and she had bowel function.  She had normal vital signs and no pain.    Consults: None  Significant Diagnostic Studies: labs: CBC    Component Value Date/Time   WBC 10.3 11/01/2021 0056   RBC 3.65 (L) 11/01/2021 0056   HGB 12.1 11/01/2021 0056   HCT 36.9 11/01/2021 0056   PLT 292 11/01/2021 0056   MCV 101.1 (H) 11/01/2021 0056   MCV 101.7 (A) 03/03/2013 1543   MCH 33.2 11/01/2021 0056   MCHC 32.8 11/01/2021 0056   RDW 11.9 11/01/2021 0056     Treatments: IV hydration and antibiotics: Zosyn  Discharge Exam: Blood pressure (!) 146/88, pulse 65, temperature 98.2 F (36.8 C), temperature source Oral, resp. rate 18, height 5\' 3"  (1.6 m), weight 82.2 kg, SpO2 98 %. General appearance: alert and cooperative Cardio: RRR GI: minimal LLQ tenderness Neurologic: Grossly normal  Disposition:      Follow-up Information     [facility]. Call.   Specialty: General Surgery Why: As needed. follow up with PCP and gastroenterology for colonoscopy. Pending colonoscopy you can follow up with one of our colorectal surgeons as needed for discussion of possible surgical management Contact information: [address] [place] [address] [facility].   Why: December 12, 2021 at 2:30 pm Contact information: [place] [address] [phone]                Signed: Joyice Faster Raekwan Spelman MD  11/03/2021, 9:43 AM

## 2021-11-03 NOTE — Discharge Instructions (Addendum)
Follow up with your primary care in 2 weeks  Recommend a colonoscopy in 6 - 8 weeks  No surgery necessary at this point in time   Call with worsening abdominal pain, nausea , vomiting  or fever over 100.5   Antibiotics for 7 more days    Take tylenol or advil for pain  Drink 64 oz of water

## 2021-11-29 ENCOUNTER — Encounter (HOSPITAL_COMMUNITY): Payer: Self-pay | Admitting: Radiology

## 2021-12-12 ENCOUNTER — Inpatient Hospital Stay: Payer: Self-pay | Admitting: Family Medicine

## 2022-12-15 ENCOUNTER — Ambulatory Visit
Admission: EM | Admit: 2022-12-15 | Discharge: 2022-12-15 | Disposition: A | Discharge status: Home / Self Care | Payer: 59 | Attending: Physician Assistant | Admitting: Physician Assistant

## 2022-12-15 DIAGNOSIS — L02213 Cutaneous abscess of chest wall: Secondary | ICD-10-CM

## 2022-12-15 MED ORDER — MUPIROCIN CALCIUM 2 % EX CREA: 1.0000 | TOPICAL_CREAM | Freq: Two times a day (BID) | CUTANEOUS | 0 refills | Status: DC

## 2022-12-15 MED ORDER — DOXYCYCLINE HYCLATE 100 MG PO CAPS: 100.0000 mg | ORAL_CAPSULE | Freq: Two times a day (BID) | ORAL | 0 refills | Status: DC

## 2022-12-15 NOTE — Discharge Instructions (Signed)
We drained the abscess today.  Please keep clean with soap and water.  Apply Bactroban ointment twice a day.  Start doxycycline 100 mg twice daily for 10 days.  Avoid sun exposure while on this medication.  Make sure that you rest and drink plenty of fluid.  If this changes in any way and becomes more painful, swollen, change in the drainage you need to be seen immediately.

## 2022-12-15 NOTE — ED Triage Notes (Signed)
Pt c/o possible insect bite mid sternal/between breasts onset ~ 2 weeks ago.

## 2022-12-15 NOTE — ED Provider Notes (Addendum)
[facility]    CSN: [id number] Arrival date & time: 12/15/22  1318      History   Chief Complaint Chief Complaint  Patient presents with   Insect Bite    HPI Amy [name] is a 61 y.o. female.   Patient presents today with a several week history of enlarging lesion on her left breast.  She reports that this initially began as a small pruritic spot after she was working in the leaves and then gradually has been enlarging.  She denies any swelling or drainage.  She denies history of recurrent skin infections or MRSA.  She has not tried any over-the-counter medications other than hydrocortisone for symptom relief.  She has not had a mammogram recently she does not currently have a primary care.  She is open to following up with someone in the near future.  Denies any fever, nausea, vomiting.  Denies history of diabetes or immunosuppression.  She denies any recent antibiotic use.    Past Medical History:  Diagnosis Date   Anemia    Fibroids    Headache(784.0)    "mostly related to weather changing"   Pneumonia 06/30/12   Syphilis 2-3 years ago   tx and resolved   Trichomonas 2 years ago   tx and resolved    Patient Active Problem List   Diagnosis Date Noted   Colonic diverticular abscess 10/30/2021   Sigmoid diverticulitis 03/05/2013   Pneumonia, community acquired 06/30/2012    Past Surgical History:  Procedure Laterality Date   CESAREAN SECTION  1993   GANGLION CYST EXCISION  05/2012   right wrist   TONSILLECTOMY     "when I was a little girl"    OB History   No obstetric history on file.      Home Medications    Prior to Admission medications   Medication Sig Start Date End Date Taking? Authorizing Provider  doxycycline (VIBRAMYCIN) 100 MG capsule Take 1 capsule (100 mg total) by mouth 2 (two) times daily. 12/15/22  Yes Crystian Frith, Junie Panning K, PA-C  mupirocin cream (BACTROBAN) 2 % Apply 1 Application topically 2 (two) times daily. 12/15/22  Yes  Earley Grobe, Derry Skill, PA-C  acetaminophen (TYLENOL) 325 MG tablet Take 650 mg by mouth every 6 (six) hours as needed for moderate pain or mild pain.    [provider]  B Complex-C (B-COMPLEX WITH VITAMIN C) tablet Take 1 tablet by mouth daily.    [provider]  cyclobenzaprine (FLEXERIL) 5 MG tablet Take 1-2 tablets (5-10 mg total) by mouth 3 (three) times daily as needed for muscle spasms. Patient not taking: No sig reported 04/19/16   Jaynee Eagles, PA-C  Iron-Vitamins (GERITOL COMPLETE) TABS Take 1 tablet by mouth daily.    [provider]  naproxen sodium (ANAPROX DS) 550 MG tablet Take 1 tablet (550 mg total) by mouth 2 (two) times daily with a meal. Patient not taking: No sig reported 04/19/16   Jaynee Eagles, PA-C  vitamin E 200 UNIT capsule Take 200 Units by mouth daily.    [provider]    Family History History reviewed. No pertinent family history.  Social History Social History   Tobacco Use   Smoking status: Former    Packs/day: 0.50    Years: 32.00    Total pack years: 16.00    Types: Cigarettes    Quit date: 06/25/2012    Years since quitting: 10.4   Smokeless tobacco: Never  Substance Use Topics  Alcohol use: Yes    Alcohol/week: 2.0 standard drinks of alcohol    Types: 2 Glasses of wine per week   Drug use: No     Allergies   Patient has no known allergies.   Review of Systems Review of Systems  Constitutional:  Positive for activity change. Negative for appetite change, fatigue and fever.  Gastrointestinal:  Negative for abdominal pain, diarrhea, nausea and vomiting.  Musculoskeletal:  Negative for arthralgias and myalgias.  Skin:  Positive for color change and wound.     Physical Exam Triage Vital Signs ED Triage Vitals [12/15/22 1513]  Enc Vitals Group     BP (!) 175/95     Pulse Rate 90     Resp 16     Temp 98 F (36.7 C)     Temp Source Oral     SpO2 98 %     Weight      Height      Head Circumference       Peak Flow      Pain Score 0     Pain Loc      Pain Edu?      Excl. in [place]?    No data found.  Updated Vital Signs BP (!) 175/95 (BP Location: Left Arm)   Pulse 90   Temp 98 F (36.7 C) (Oral)   Resp 16   SpO2 98%   Visual Acuity Right Eye Distance:   Left Eye Distance:   Bilateral Distance:    Right Eye Near:   Left Eye Near:    Bilateral Near:     Physical Exam Vitals reviewed.  Constitutional:      General: She is awake. She is not in acute distress.    Appearance: Normal appearance. She is well-developed. She is not ill-appearing.     Comments: Very pleasant female appears stated age in no acute distress sitting comfortably in exam room  HENT:     Head: Normocephalic and atraumatic.  Cardiovascular:     Rate and Rhythm: Normal rate and regular rhythm.     Heart sounds: Normal heart sounds, S1 normal and S2 normal. No murmur heard. Pulmonary:     Effort: Pulmonary effort is normal.     Breath sounds: Normal breath sounds. No wheezing, rhonchi or rales.     Comments: Clear to auscultation bilaterally Chest:     Chest wall: No deformity, swelling or tenderness.    Psychiatric:        Behavior: Behavior is cooperative.      UC Treatments / Results  Labs (all labs ordered are listed, but only abnormal results are displayed) Labs Reviewed - No data to display  EKG   Radiology No results found.  Procedures Incision and Drainage  Date/Time: 12/15/2022 4:11 PM  Performed by: Terrilee Croak, PA-C Authorized by: Terrilee Croak, PA-C   Consent:    Consent obtained:  Verbal   Consent given by:  Patient   Risks discussed:  Bleeding, incomplete drainage and infection   Alternatives discussed:  Observation, referral and alternative treatment Universal protocol:    Procedure explained and questions answered to patient or proxy's satisfaction: yes     Patient identity confirmed:  Verbally with patient Location:    Type:  Abscess   Size:  3 cm x 3 cm    Location:  Trunk   Trunk location:  Chest Pre-procedure details:    Skin preparation:  Chlorhexidine with alcohol Sedation:  Sedation type:  None Anesthesia:    Anesthesia method:  Local infiltration   Local anesthetic:  Lidocaine 1% WITH epi Procedure type:    Complexity:  Simple Procedure details:    Ultrasound guidance: no     Needle aspiration: no     Incision types:  Stab incision   Incision depth:  Dermal   Drainage:  Bloody and purulent   Drainage amount:  Copious   Wound treatment:  Wound left open   Packing materials:  None Post-procedure details:    Procedure completion:  Tolerated  (including critical care time)  Medications Ordered in UC Medications - No data to display  Initial Impression / Assessment and Plan / UC Course  I have reviewed the triage vital signs and the nursing notes.  Pertinent labs & imaging results that were available during my care of the patient were reviewed by me and considered in my medical decision making (see chart for details).     Abscess was successfully drained in clinic today.  See procedure note above.  Will start doxycycline 100 mg twice daily for 10 days.  Discussed that she is to keep the area clean and apply Bactroban ointment twice daily.  Can alternate Tylenol and ibuprofen for pain.  Discussed that if this changes or worsens in any way she should follow-up with our clinic including enlarging lesion, swelling, change in character of drainage, fever, nausea, vomiting.  She does not currently have a primary care so we will try to establish her with somebody via PCP assistance.  Strict return precautions given.  Final Clinical Impressions(s) / UC Diagnoses   Final diagnoses:  Chest wall abscess     Discharge Instructions      We drained the abscess today.  Please keep clean with soap and water.  Apply Bactroban ointment twice a day.  Start doxycycline 100 mg twice daily for 10 days.  Avoid sun exposure while on this  medication.  Make sure that you rest and drink plenty of fluid.  If this changes in any way and becomes more painful, swollen, change in the drainage you need to be seen immediately.     ED Prescriptions     Medication Sig Dispense Auth. Provider   doxycycline (VIBRAMYCIN) 100 MG capsule Take 1 capsule (100 mg total) by mouth 2 (two) times daily. 20 capsule Dionel Archey K, PA-C   mupirocin cream (BACTROBAN) 2 % Apply 1 Application topically 2 (two) times daily. 15 g Gladys Deckard, Derry Skill, PA-C      PDMP not reviewed this encounter.   Terrilee Croak, PA-C 12/15/22 1611    Shareese Macha, Derry Skill, PA-C 12/15/22 1612

## 2023-01-30 ENCOUNTER — Encounter: Payer: Self-pay | Admitting: Family Medicine

## 2023-01-30 ENCOUNTER — Ambulatory Visit: Payer: Commercial Managed Care - HMO | Admitting: Family Medicine

## 2023-01-30 VITALS — BP 122/82 | HR 87 | Temp 97.8°F | Ht 63.0 in | Wt 196.0 lb

## 2023-01-30 DIAGNOSIS — Z1231 Encounter for screening mammogram for malignant neoplasm of breast: Secondary | ICD-10-CM

## 2023-01-30 DIAGNOSIS — Z124 Encounter for screening for malignant neoplasm of cervix: Secondary | ICD-10-CM

## 2023-01-30 DIAGNOSIS — Z23 Encounter for immunization: Secondary | ICD-10-CM

## 2023-01-30 DIAGNOSIS — Z7689 Persons encountering health services in other specified circumstances: Secondary | ICD-10-CM

## 2023-01-30 DIAGNOSIS — Z8719 Personal history of other diseases of the digestive system: Secondary | ICD-10-CM

## 2023-01-30 DIAGNOSIS — E669 Obesity, unspecified: Secondary | ICD-10-CM | POA: Diagnosis not present

## 2023-01-30 DIAGNOSIS — Z6834 Body mass index (BMI) 34.0-34.9, adult: Secondary | ICD-10-CM

## 2023-01-30 DIAGNOSIS — Z1211 Encounter for screening for malignant neoplasm of colon: Secondary | ICD-10-CM

## 2023-01-30 NOTE — Patient Instructions (Signed)
I have referred you to [facility] and they will call you.   I also referred you to a gynecologist and they will call you to schedule.   You can call and schedule your mammogram at [facility]. [phone]  Follow up fasting for a preventive healthcare visit.

## 2023-01-30 NOTE — Progress Notes (Signed)
New Patient Office Visit  Subjective    Patient ID: Amy [name], female    DOB: February 27, 1961  Age: 62 y.o. MRN: [id number]  CC:  Chief Complaint  Patient presents with   Establish Care    No concerns, has not had PCP only UC if she needed anything    HPI Amy [name] presents to establish care  No regular medical care in years.  States she is way overdue for preventive health care. Last mammogram more than 10 years ago.  She cannot recall her last Pap smear.  She has never had a colonoscopy. Reviewed ED notes from diverticulitis and history of diverticular abscess.  She would like to discuss immunizations.  Denies fever, chills, dizziness, chest pain, palpitations, shortness of breath, abdominal pain, N/V/D, urinary symptoms, LE edema.      Outpatient Encounter Medications as of 01/30/2023  Medication Sig   B Complex-C (B-COMPLEX WITH VITAMIN C) tablet Take 1 tablet by mouth daily.   Iron-Vitamins (GERITOL COMPLETE) TABS Take 1 tablet by mouth daily.   mupirocin cream (BACTROBAN) 2 % Apply 1 Application topically 2 (two) times daily.   vitamin E 200 UNIT capsule Take 200 Units by mouth daily.   acetaminophen (TYLENOL) 325 MG tablet Take 650 mg by mouth every 6 (six) hours as needed for moderate pain or mild pain.   [DISCONTINUED] cyclobenzaprine (FLEXERIL) 5 MG tablet Take 1-2 tablets (5-10 mg total) by mouth 3 (three) times daily as needed for muscle spasms. (Patient not taking: Reported on 10/30/2021)   [DISCONTINUED] doxycycline (VIBRAMYCIN) 100 MG capsule Take 1 capsule (100 mg total) by mouth 2 (two) times daily. (Patient not taking: Reported on 01/30/2023)   [DISCONTINUED] naproxen sodium (ANAPROX DS) 550 MG tablet Take 1 tablet (550 mg total) by mouth 2 (two) times daily with a meal. (Patient not taking: Reported on 10/30/2021)   No facility-administered encounter medications on file as of 01/30/2023.    Past Medical History:  Diagnosis Date   Anemia    Fibroids     Headache(784.0)    "mostly related to weather changing"   Pneumonia 06/30/12   Syphilis 2-3 years ago   tx and resolved   Trichomonas 2 years ago   tx and resolved    Past Surgical History:  Procedure Laterality Date   Malcolm CYST EXCISION  05/2012   right wrist   TONSILLECTOMY     "when I was a little girl"    History reviewed. No pertinent family history.  Social History   Socioeconomic History   Marital status: Legally Separated    Spouse name: Not on file   Number of children: 1   Years of education: Not on file   Highest education level: Not on file  Occupational History    Employer: [employer]  Tobacco Use   Smoking status: Former    Packs/day: 0.50    Years: 32.00    Total pack years: 16.00    Types: Cigarettes    Quit date: 06/25/2012    Years since quitting: 10.6   Smokeless tobacco: Never  Substance and Sexual Activity   Alcohol use: Yes    Alcohol/week: 2.0 standard drinks of alcohol    Types: 2 Glasses of wine per week   Drug use: No   Sexual activity: Yes    Comment: 1 partner last 6 months  Other Topics Concern   Not on file  Social History Narrative  Not on file   Social Determinants of Health   Financial Resource Strain: Not on file  Food Insecurity: Not on file  Transportation Needs: Not on file  Physical Activity: Not on file  Stress: Not on file  Social Connections: Not on file  Intimate Partner Violence: Not on file    ROS      Objective    BP 122/82 (BP Location: Left Arm, Patient Position: Sitting, Cuff Size: Large)   Pulse 87   Temp 97.8 F (36.6 C) (Temporal)   Ht '5\' 3"'$  (1.6 m)   Wt 196 lb (88.9 kg)   SpO2 99%   BMI 34.72 kg/m   Physical Exam Constitutional:      General: She is not in acute distress.    Appearance: She is not ill-appearing.  Cardiovascular:     Rate and Rhythm: Normal rate.  Pulmonary:     Effort: Pulmonary effort is normal.  Skin:    General: Skin is dry.   Neurological:     General: No focal deficit present.     Mental Status: She is alert and oriented to person, place, and time.  Psychiatric:        Mood and Affect: Mood normal.        Behavior: Behavior normal.        Thought Content: Thought content normal.         Assessment & Plan:   Problem List Items Addressed This Visit   None Visit Diagnoses     History of diverticular abscess    -  Primary   Relevant Orders   Ambulatory referral to Gastroenterology   Encounter to establish care       Encounter for screening mammogram for malignant neoplasm of breast       Relevant Orders   MM 3D SCREEN BREAST BILATERAL   Screen for colon cancer       Relevant Orders   Ambulatory referral to Gastroenterology   Screening for cervical cancer       Relevant Orders   Ambulatory referral to Gynecology   Obesity (BMI 30-39.9)       Need for influenza vaccination       Relevant Orders   Flu Vaccine QUAD 38moIM (Fluarix, Fluzone & Alfiuria Quad PF) (Completed)      She is a pleasant 62year old female who is new to the practice and here to establish care.  She has not had a PCP in several years.  Reports history of diverticulitis and diverticular abscess and did not follow-up with GI as recommended.  Currently asymptomatic.  Referral to GI.  She has not had a colonoscopy and will need to get this as well. Referral to gynecology for Pap smear and women's health. Mammogram ordered and she will call and schedule this at the breast center. She will follow-up for a fasting CPE.  Check labs at that time and look for complications related to obesity. Flu shot given.  We did discuss Shingrix.  She will get her first shingles vaccine at her next visit.  Return for needs fasting CPE.   VHarland Dingwall NP-C

## 2023-02-03 ENCOUNTER — Ambulatory Visit
Admission: RE | Admit: 2023-02-03 | Discharge: 2023-02-03 | Disposition: A | Discharge status: Home / Self Care | Payer: Commercial Managed Care - HMO | Source: Ambulatory Visit | Attending: Family Medicine | Admitting: Family Medicine

## 2023-02-03 DIAGNOSIS — Z1231 Encounter for screening mammogram for malignant neoplasm of breast: Secondary | ICD-10-CM

## 2023-02-05 ENCOUNTER — Other Ambulatory Visit: Payer: Self-pay | Admitting: Family Medicine

## 2023-02-05 DIAGNOSIS — R928 Other abnormal and inconclusive findings on diagnostic imaging of breast: Secondary | ICD-10-CM

## 2023-02-17 ENCOUNTER — Ambulatory Visit
Admission: RE | Admit: 2023-02-17 | Discharge: 2023-02-17 | Disposition: A | Discharge status: Home / Self Care | Payer: Commercial Managed Care - HMO | Source: Ambulatory Visit | Attending: Family Medicine | Admitting: Family Medicine

## 2023-02-17 DIAGNOSIS — R928 Other abnormal and inconclusive findings on diagnostic imaging of breast: Secondary | ICD-10-CM

## 2023-02-19 ENCOUNTER — Other Ambulatory Visit: Payer: Self-pay | Admitting: Family Medicine

## 2023-02-19 DIAGNOSIS — R921 Mammographic calcification found on diagnostic imaging of breast: Secondary | ICD-10-CM

## 2023-02-19 DIAGNOSIS — N6320 Unspecified lump in the left breast, unspecified quadrant: Secondary | ICD-10-CM

## 2023-02-28 ENCOUNTER — Ambulatory Visit
Admission: RE | Admit: 2023-02-28 | Discharge: 2023-02-28 | Disposition: A | Discharge status: Home / Self Care | Payer: Commercial Managed Care - HMO | Source: Ambulatory Visit | Attending: Family Medicine | Admitting: Family Medicine

## 2023-02-28 DIAGNOSIS — R921 Mammographic calcification found on diagnostic imaging of breast: Secondary | ICD-10-CM

## 2023-02-28 DIAGNOSIS — N6320 Unspecified lump in the left breast, unspecified quadrant: Secondary | ICD-10-CM

## 2023-02-28 HISTORY — PX: BREAST BIOPSY: SHX20

## 2023-03-04 ENCOUNTER — Other Ambulatory Visit (HOSPITAL_COMMUNITY)
Admission: RE | Admit: 2023-03-04 | Discharge: 2023-03-04 | Disposition: A | Discharge status: Home / Self Care | Payer: 59 | Source: Ambulatory Visit | Attending: Radiology | Admitting: Radiology

## 2023-03-04 ENCOUNTER — Encounter: Payer: Self-pay | Admitting: Radiology

## 2023-03-04 ENCOUNTER — Ambulatory Visit (INDEPENDENT_AMBULATORY_CARE_PROVIDER_SITE_OTHER): Payer: Commercial Managed Care - HMO | Admitting: Radiology

## 2023-03-04 VITALS — BP 130/88 | Ht 62.5 in | Wt 197.0 lb

## 2023-03-04 DIAGNOSIS — Z01419 Encounter for gynecological examination (general) (routine) without abnormal findings: Secondary | ICD-10-CM | POA: Diagnosis not present

## 2023-03-04 DIAGNOSIS — E2839 Other primary ovarian failure: Secondary | ICD-10-CM

## 2023-03-04 NOTE — Progress Notes (Signed)
   ESTER HILLEY [birth year] [id number]   History: Postmenopausal 62 y.o. presents for annual exam. No gyn concerns.    Gynecologic History Postmenopausal Last Pap: 2012. Results were: normal Last mammogram: 2024. Results were: abnormal, biopsy report below, has appt with [facility] with a  surgeon Last colonoscopy: never DEXA:never HRT use: never  Obstetric History OB History  Gravida Para Term Preterm AB Living  2 1       1   SAB IAB Ectopic Multiple Live Births               # Outcome Date GA Lbr Len/2nd Weight Sex Delivery Anes PTL Lv  2 Gravida           1 Para              The following portions of the patient's history were reviewed and updated as appropriate: allergies, current medications, past family history, past medical history, past social history, past surgical history, and problem list.  Review of Systems Pertinent items noted in HPI and remainder of comprehensive ROS otherwise negative.  Past medical history, past surgical history, family history and social history were all reviewed and documented in the EPIC chart.  Breast, left, needle core biopsy, outer, 3 o'clock, ribbon clip - INTRADUCTAL PAPILLOMA WITH SCLEROSING FIBROSIS, SEE NOTE 2. Breast, right, needle core biopsy, UOQ at middle to posterior depth, ribbon clip - FIBROADENOMATOID CHANGE WITH DYSTROPHIC CALCIFICATIONS, SEE NOTE - SMALL INCIDENTAL INTRADUCTAL PAPILLOMA, 2.5 MM Diagnosis Note 1. and 2. [facility] was notified on 03/03/2023.  Exam:  Vitals:   03/04/23 1453  BP: 130/88  Weight: 197 lb (89.4 kg)  Height: 5' 2.5" (1.588 m)   Body mass index is 35.46 kg/m.  General appearance:  Normal, obese Thyroid:  Symmetrical, normal in size, without palpable masses or nodularity. Respiratory  Auscultation:  Clear without wheezing or rhonchi Cardiovascular  Auscultation:  Regular rate, without rubs, murmurs or gallops  Edema/varicosities:  Not grossly  evident Abdominal  Soft,nontender, without masses, guarding or rebound.  Liver/spleen:  No organomegaly noted  Hernia:  None appreciated  Skin  Inspection:  Grossly normal Breasts: Examined lying and sitting.   Right: Without masses, retractions, nipple discharge or axillary adenopathy.   Left: Bruising from biopsy. Without masses, retractions, nipple discharge or axillary adenopathy. Genitourinary   Inguinal/mons:  Normal without inguinal adenopathy  External genitalia:  Normal appearing vulva with no masses, tenderness, or lesions  BUS/Urethra/Skene's glands:  Normal  Vagina:  Normal appearing with normal color and discharge, no lesions. Atrophy: mild   Cervix:  Normal appearing without discharge or lesions  Uterus:  Normal in size, shape and contour.  Midline and mobile, nontender  Adnexa/parametria:     Rt: Normal in size, without masses or tenderness.   Lt: Normal in size, without masses or tenderness.  Anus and perineum: Normal    Patient informed chaperone available to be present for breast and pelvic exam. Patient has requested no chaperone to be present. Patient has been advised what will be completed during breast and pelvic exam.   Assessment/Plan:   1. Well woman exam with routine gynecological exam - Schedule DEXA - Cytology - PAP( [facility])    Discussed SBE, colonoscopy and DEXA screening as directed. Recommend 149mins of exercise weekly, including weight bearing exercise. Encouraged the use of seatbelts and sunscreen.  Return in 1 year for annual or sooner prn.  Rubbie Battiest B WHNP-BC, 3:29 PM 03/04/2023

## 2023-03-07 ENCOUNTER — Other Ambulatory Visit: Payer: Self-pay

## 2023-03-07 ENCOUNTER — Telehealth: Payer: Self-pay

## 2023-03-07 LAB — CYTOLOGY - PAP
Comment: NEGATIVE
Diagnosis: NEGATIVE
High risk HPV: NEGATIVE

## 2023-03-07 MED ORDER — METRONIDAZOLE 500 MG PO TABS: 500.0000 mg | ORAL_TABLET | Freq: Two times a day (BID) | ORAL | 0 refills | Status: DC

## 2023-03-07 NOTE — Telephone Encounter (Signed)
-----   Message from Kerry Dory, NP sent at 03/07/2023  2:27 PM EDT ----- Dalbert Batman on pap. Flagyl 500mg  po BID x 7 days, partner needs treatment. Abstain. TOC 6 weeks with full STI screen recommended at that visit.

## 2023-03-07 NOTE — Telephone Encounter (Signed)
Pt was called and confirmed understanding of results. Pt confirmed understanding of TOC in 6 weeks and partner treatment, as well as prescription.

## 2023-03-09 IMAGING — CT CT ABD-PELV W/ CM
2 of 5 series · 16 of 46 positions shown, 18 images · IV contrast (omnipaque)
Comparison: 03/03/2013

CLINICAL DATA: Abdominal pain on the left for 2 weeks.

EXAM:
CT ABDOMEN AND PELVIS WITH CONTRAST
TECHNIQUE: Multidetector CT imaging of the abdomen and pelvis was performed
using the standard protocol following bolus administration of
intravenous contrast.
CONTRAST:  100mL OMNIPAQUE IOHEXOL 350 MG/ML SOLN

[Series 3: abd/ pelvis 5.0 i30f 2 · axial · 0.87mm/px · z∈[+968,+1388]mm · 13 of 94 slices shown, 15 images]
[im 5/94  soft-tissue]
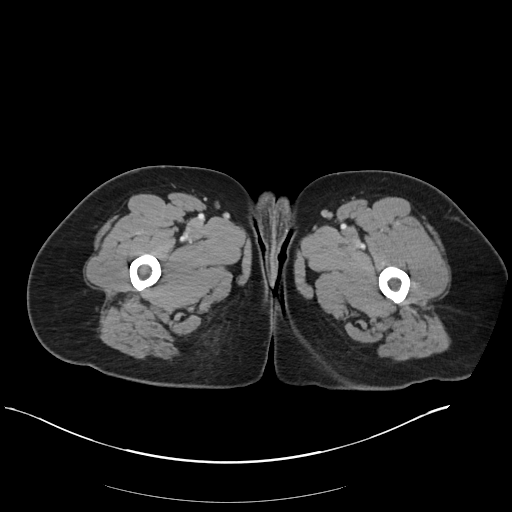
[im 5/94  bone]
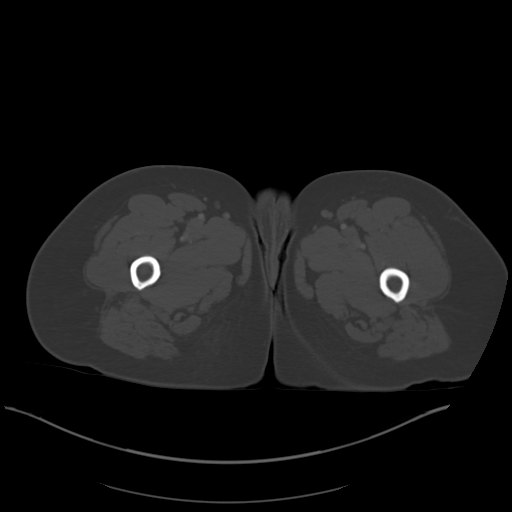
[im 15/94  soft-tissue]
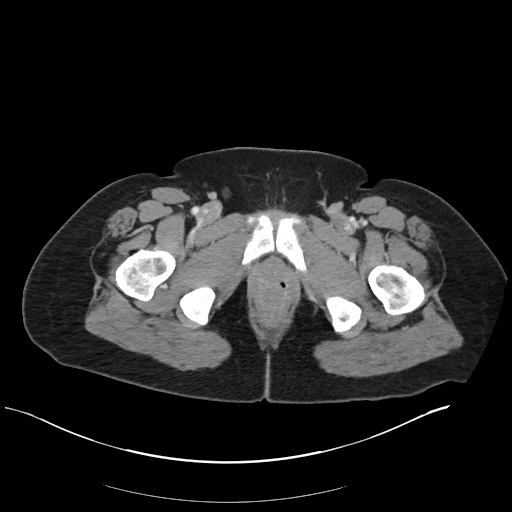
[im 20/94  soft-tissue]
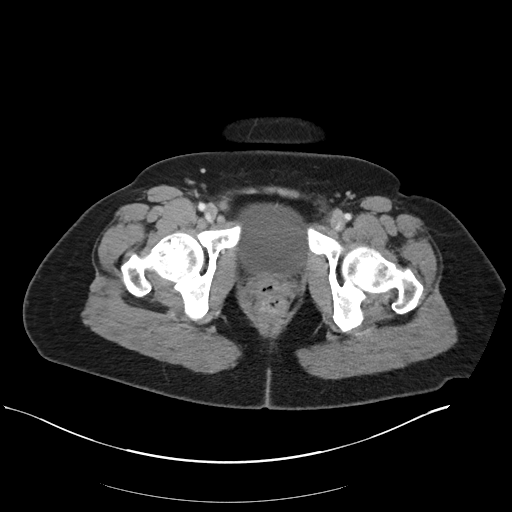
[im 25/94  soft-tissue]
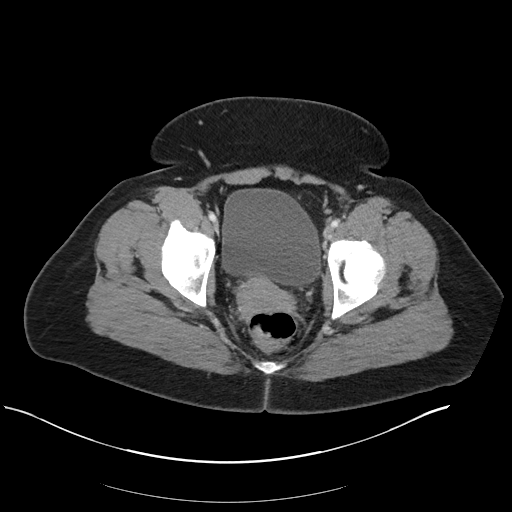
[im 35/94  soft-tissue]
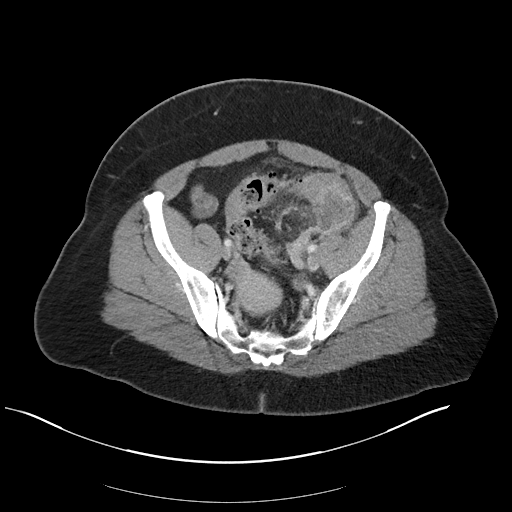
[im 40/94  soft-tissue]
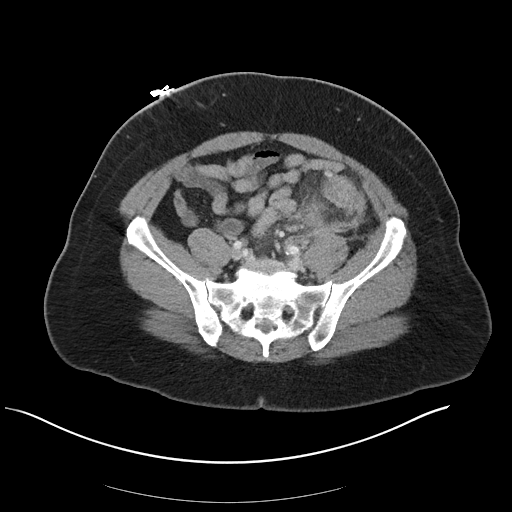
[im 49/94  soft-tissue]
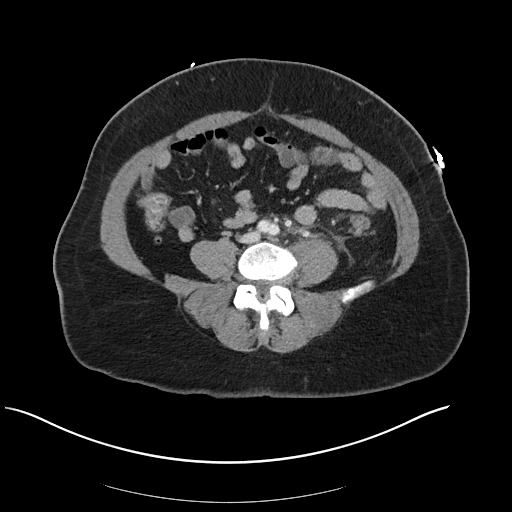
[im 54/94  soft-tissue]
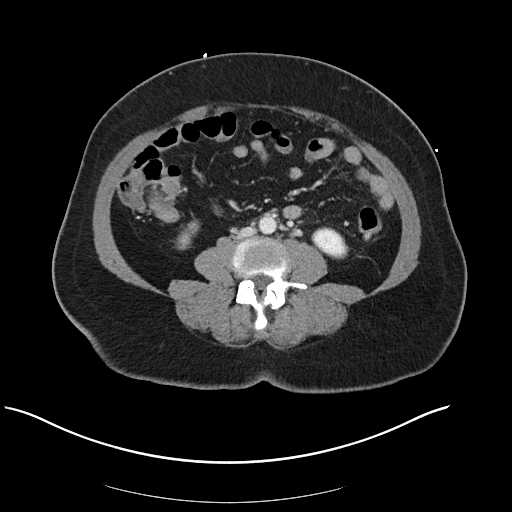
[im 59/94  soft-tissue]
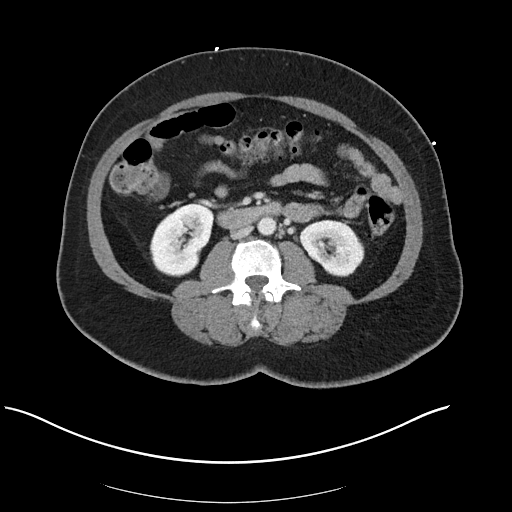
[im 59/94  bone]
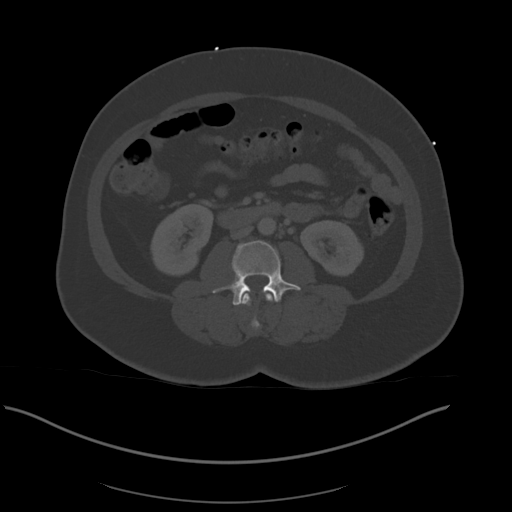
[im 69/94  soft-tissue]
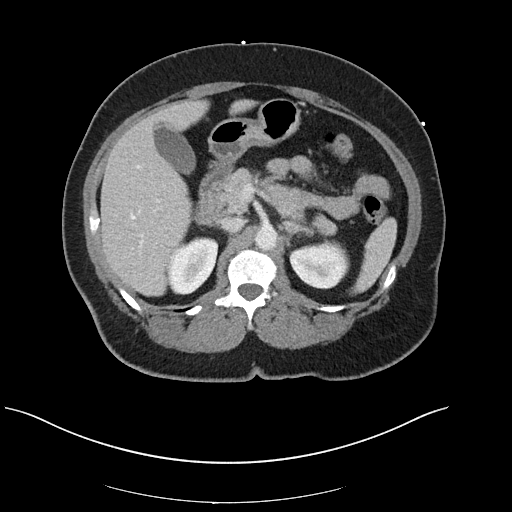
[im 74/94  soft-tissue]
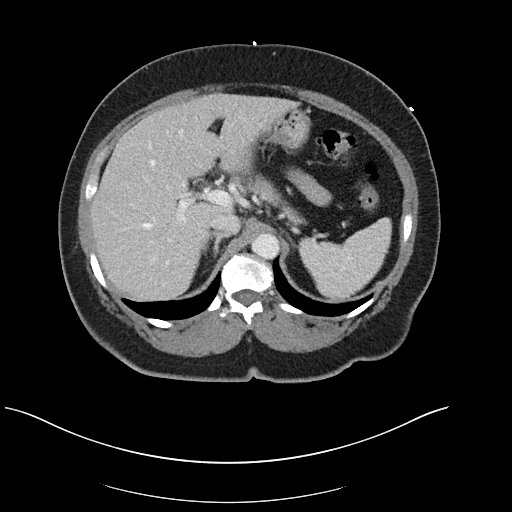
[im 79/94  soft-tissue]
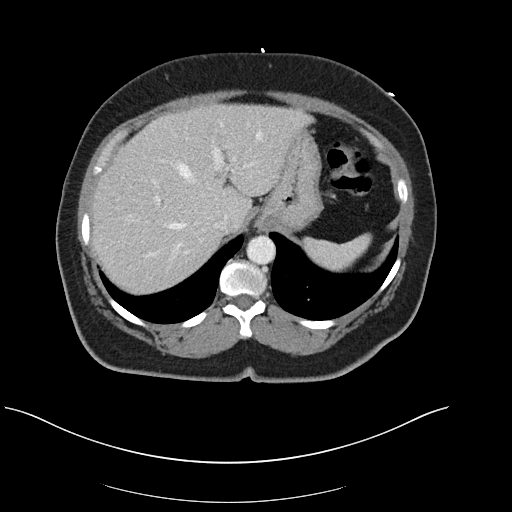
[im 89/94  soft-tissue]
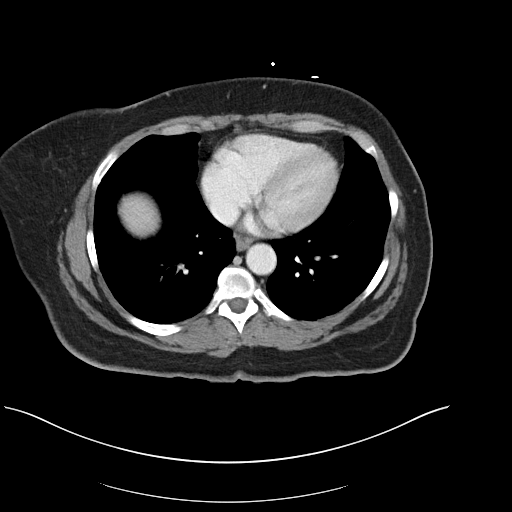

[Series 6: coronal soft tissue · coronal · 0.81mm/px · 3 of 113 slices shown]
[im 38/113  soft-tissue]
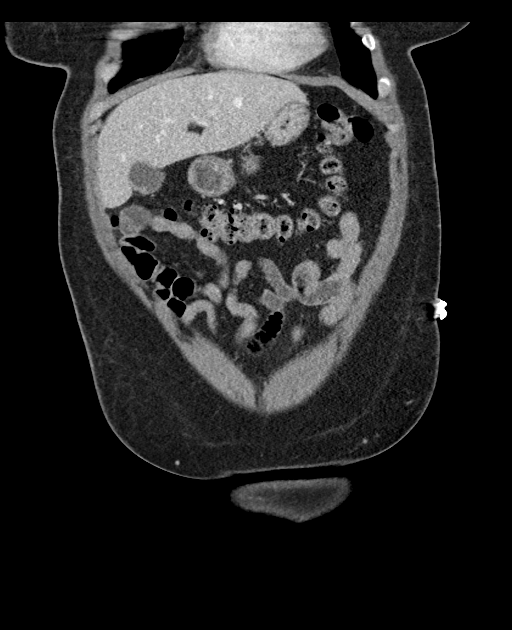
[im 50/113  soft-tissue]
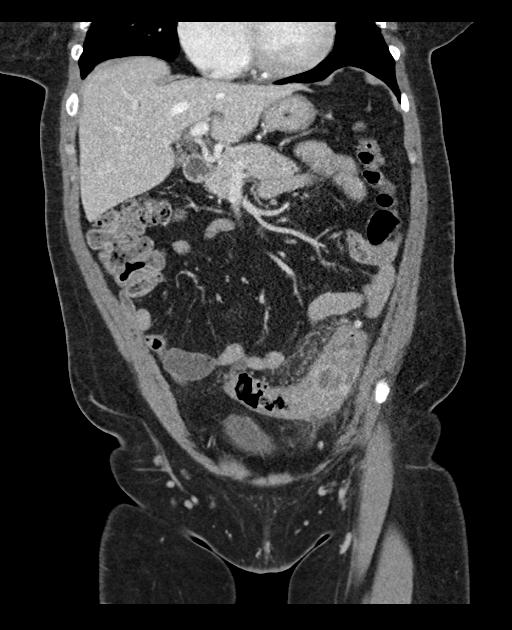
[im 63/113  soft-tissue]
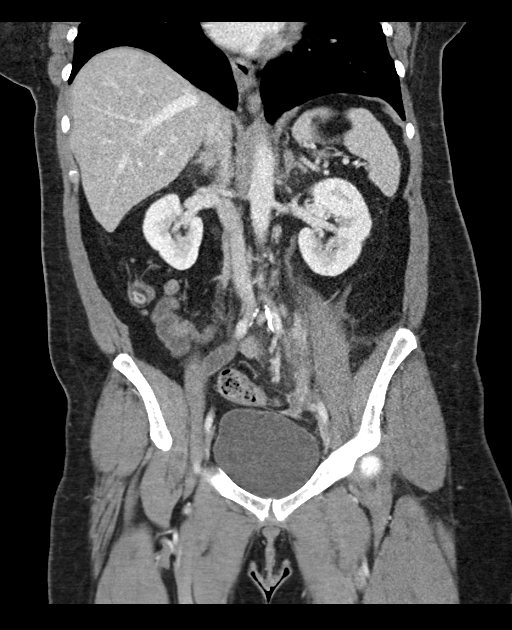

[16 of 46 positions shown; findings below may reference images not displayed]

FINDINGS: Lower chest: Unremarkable

Hepatobiliary: No suspicious focal abnormality within the liver
parenchyma. Similar appearance gallbladder fundus, likely
adenomyomatosis. No intrahepatic or extrahepatic biliary dilation.

Pancreas: No focal mass lesion. No dilatation of the main duct. No
intraparenchymal cyst. No peripancreatic edema.

Spleen: No splenomegaly. No focal mass lesion.

Adrenals/Urinary Tract: No adrenal nodule or mass. Kidneys
unremarkable. No evidence for hydroureter. The urinary bladder
appears normal for the degree of distention.

Stomach/Bowel: Tiny hiatal hernia. Stomach otherwise unremarkable.
Duodenum is normally positioned as is the ligament of Treitz. No
small bowel wall thickening. No small bowel dilatation. The terminal
ileum is normal. The appendix is normal. Diverticular changes are
noted in the left colon. There is no wall thickening in the proximal
sigmoid colon with pericolonic edema/inflammation and a 3.6 x 3.6 cm
rim enhancing pericolonic fluid collection compatible with abscess
(image 59/3).

Vascular/Lymphatic: There is mild atherosclerotic calcification of
the abdominal aorta without aneurysm. There is no gastrohepatic or
hepatoduodenal ligament lymphadenopathy. No retroperitoneal or
mesenteric lymphadenopathy. No pelvic sidewall lymphadenopathy.

Reproductive: Lobular uterine contour with distortion of the
endometrial canal is suggestive of fibroid disease. There is no
adnexal mass.

Other: Trace intraperitoneal free fluid

Musculoskeletal: No worrisome lytic or sclerotic osseous
abnormality.
IMPRESSION: 1. Sigmoid diverticulitis with pericolonic edema/inflammation and a
3.6 x 3.6 cm rim enhancing para colonic fluid collection compatible
with abscess.
2. Soft tissue fullness in the myometrium with distortion of the
endometrial canal, likely related to uterine fibroid disease. Pelvic
ultrasound recommended to confirm.
3. Tiny hiatal hernia.
4. Aortic Atherosclerosis ([id number]).

## 2023-03-27 ENCOUNTER — Other Ambulatory Visit: Payer: Self-pay | Admitting: Surgery

## 2023-03-27 DIAGNOSIS — D242 Benign neoplasm of left breast: Secondary | ICD-10-CM

## 2023-04-03 ENCOUNTER — Other Ambulatory Visit: Payer: Self-pay | Admitting: Surgery

## 2023-04-03 DIAGNOSIS — D241 Benign neoplasm of right breast: Secondary | ICD-10-CM

## 2023-04-15 ENCOUNTER — Ambulatory Visit: Payer: 59 | Admitting: Radiology

## 2023-04-25 ENCOUNTER — Ambulatory Visit (INDEPENDENT_AMBULATORY_CARE_PROVIDER_SITE_OTHER): Payer: 59 | Admitting: Obstetrics & Gynecology

## 2023-04-25 ENCOUNTER — Encounter: Payer: Self-pay | Admitting: Obstetrics & Gynecology

## 2023-04-25 VITALS — BP 116/70 | HR 75

## 2023-04-25 DIAGNOSIS — Z113 Encounter for screening for infections with a predominantly sexual mode of transmission: Secondary | ICD-10-CM

## 2023-04-25 DIAGNOSIS — Z8619 Personal history of other infectious and parasitic diseases: Secondary | ICD-10-CM | POA: Diagnosis not present

## 2023-04-25 LAB — WET PREP FOR TRICH, YEAST, CLUE

## 2023-04-25 NOTE — Progress Notes (Signed)
    Amy [name] 1961/06/29 [id number]        62 y.o.  G2P1001   RP: TOC for Vaginal Trichomoniasis  HPI: Trichomonas present on Pap test 03/04/23.  Treated with Metronidazole x 7 days 03/07/23.  Patient was asymptomatic before and after treatment.  No vaginal d/c.  No pelvic pain. Uses condoms.     OB History  Gravida Para Term Preterm AB Living  2 1 1     1   SAB IAB Ectopic Multiple Live Births          1    # Outcome Date GA Lbr Len/2nd Weight Sex Delivery Anes PTL Lv  2 Gravida         FD  1 Term             Past medical history,surgical history, problem list, medications, allergies, family history and social history were all reviewed and documented in the EPIC chart.   Directed ROS with pertinent positives and negatives documented in the history of present illness/assessment and plan.  Exam:  Vitals:   04/25/23 0955  BP: 116/70  Pulse: 75  SpO2: 99%   General appearance:  Normal  Gynecologic exam: Vulva normal.  Speculum:  Cervix/Vagina normal.  Normal vaginal secretions.  Wet prep and SureSwab Advanced Plus done.  Wet prep Negative   Assessment/Plan:  62 y.o. G2P1001   1. H/O trichomonal vaginitis Trichomonas present on Pap test 03/04/23.  Treated with Metronidazole x 7 days 03/07/23.  Patient was asymptomatic before and after treatment.  No vaginal d/c.  No pelvic pain. Uses condoms. Normal gyn exam.  Wet prep Neg.  Patient reassured.  SureSwab Advanced Plus pending. - WET PREP FOR TRICH, YEAST, CLUE - SureSwab Advanced Vaginitis Plus,TMA   Genia Del MD, 10:08 AM 04/25/2023

## 2023-04-26 LAB — SURESWAB® ADVANCED VAGINITIS PLUS,TMA
C. trachomatis RNA, TMA: NOT DETECTED
CANDIDA SPECIES: NOT DETECTED
Candida glabrata: NOT DETECTED
N. gonorrhoeae RNA, TMA: NOT DETECTED
SURESWAB(R) ADV BACTERIAL VAGINOSIS(BV),TMA: POSITIVE — AB
TRICHOMONAS VAGINALIS (TV),TMA: NOT DETECTED

## 2023-04-28 ENCOUNTER — Other Ambulatory Visit: Payer: Self-pay

## 2023-04-28 ENCOUNTER — Encounter (HOSPITAL_BASED_OUTPATIENT_CLINIC_OR_DEPARTMENT_OTHER): Payer: Self-pay | Admitting: Surgery

## 2023-05-02 ENCOUNTER — Ambulatory Visit
Admission: RE | Admit: 2023-05-02 | Discharge: 2023-05-02 | Disposition: A | Discharge status: Home / Self Care | Payer: 59 | Source: Ambulatory Visit | Attending: Surgery | Admitting: Surgery

## 2023-05-02 DIAGNOSIS — D241 Benign neoplasm of right breast: Secondary | ICD-10-CM

## 2023-05-02 HISTORY — PX: BREAST BIOPSY: SHX20

## 2023-05-02 MED ORDER — ENSURE PRE-SURGERY PO LIQD: 296.0000 mL | Freq: Once | ORAL | Status: DC

## 2023-05-02 NOTE — Progress Notes (Signed)
CHG soap and ensure given to patient. Written and verbal instructions given to patient. Patient denies questions at this time.

## 2023-05-02 NOTE — Progress Notes (Signed)
       Patient Instructions  The night before surgery:  No food after midnight. ONLY clear liquids after midnight  The day of surgery (if you do NOT have diabetes):  Drink ONE (1) Pre-Surgery Clear Ensure as directed.   This drink was given to you during your hospital  pre-op appointment visit. The pre-op nurse will instruct you on the time to drink the  Pre-Surgery Ensure depending on your surgery time. Finish the drink at the designated time by the pre-op nurse.  Nothing else to drink after completing the  Pre-Surgery Clear Ensure.         If you have questions, please contact your surgeon's office. 

## 2023-05-04 NOTE — H&P (Signed)
REFERRING PHYSICIAN: Avanell Shackleton, NP PROVIDER: Wayne Both, MD MRN: [id number] DOB: 1961-05-29  Subjective  Chief Complaint: New Consultation (Bilateral breast Intraductal Papillomas)  History of Present Illness: Amy [name] is a 62 y.o. 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 For years, she has been having clear intermittent discharge from her left breast. She underwent bilateral mammogram showing calcifications in the right breast as well as calcifications and a possible mass and asymmetry in the left breast. An ultrasound showed a 1.7 cm intraductal mass at the 3 o'clock position of the left breast 5 cm from the nipple and 9 mm of indeterminate calcifications in the upper outer quadrant of the right breast. Biopsies were performed on both areas. The left breast showed an intraductal papilloma with sclerosing fibrosis. The right breast showed fibroadenomatoid changes and a small intraductal papilloma.  She has had no previous problems regarding her breast. There is no family history of breast cancer. She is otherwise healthy without complaints.  Review of Systems: A complete review of systems was obtained from the patient. I have reviewed this information and discussed as appropriate with the patient. See HPI as well for other ROS.  ROS  Medical History: Past Medical History: Diagnosis Date Arthritis  There is no problem list on file for this patient.  History reviewed. No pertinent surgical history.  No Known Allergies  No current outpatient medications on file prior to visit.  No current facility-administered medications on file prior to visit.  Family History Problem Relation Age of Onset Coronary Artery Disease (Blocked arteries around heart)  Sister Stroke Sister   Social History  Tobacco Use Smoking Status Former Types: Cigarettes Smokeless Tobacco Never   Social History  Socioeconomic History Marital status: Single Tobacco Use Smoking status: Former Types: Cigarettes Smokeless tobacco: Never Vaping Use Vaping Use: Never used Substance and Sexual Activity Alcohol use: Yes Drug use: Never  Objective:  Vitals: 03/27/23 1537 BP: 110/70 Pulse: 60 Temp: 36.5 C (97.7 F) SpO2: 97% Weight: 90.3 kg (199 lb) Height: 160 cm (5\' 3" ) PainSc: 0-No pain  Body mass index is 35.25 kg/m.  Physical Exam  She appears well on exam  She has large breast bilaterally. There are no palpable masses and the nipple areolar complexes are normal in appearance other than inverted nipples which she said has been like that her whole life  There is no axillary adenopathy on either side  Labs, Imaging and Diagnostic Testing: I have reviewed her mammograms, ultrasound, and pathology results  Assessment and Plan:  Diagnoses and all orders for this visit:  Intraductal papilloma of both breasts   I discussed her mammograms and her pathology results with her in detail. We discussed the reasons that we remove papillomas for complete histologic evaluation drawl malignancy and especially in light of her nipple discharge. Radiology said to consider a 59-month follow-up on the right breast but she would like to go ahead and have both papillomas removed at the same surgery rather than wait for another film after surgery on the left breast alone. I believe this is very reasonable. We discussed proceeding with bilateral radioactive seed guided breast lumpectomies. I explained the surgical procedure in detail. We discussed the risks which includes but is not limited to bleeding, infection, injury to surrounding structures, the need for further  surgery malignancy is found, cardiopulmonary issues with surgery, postoperative recovery, etc. Again  she understands and surgery will be scheduled.

## 2023-05-05 ENCOUNTER — Ambulatory Visit (HOSPITAL_BASED_OUTPATIENT_CLINIC_OR_DEPARTMENT_OTHER): Payer: 59 | Admitting: Certified Registered Nurse Anesthetist

## 2023-05-05 ENCOUNTER — Encounter (HOSPITAL_BASED_OUTPATIENT_CLINIC_OR_DEPARTMENT_OTHER): Payer: Self-pay | Admitting: Surgery

## 2023-05-05 ENCOUNTER — Ambulatory Visit
Admission: RE | Admit: 2023-05-05 | Discharge: 2023-05-05 | Disposition: A | Discharge status: Home / Self Care | Payer: 59 | Source: Ambulatory Visit | Attending: Surgery | Admitting: Surgery

## 2023-05-05 ENCOUNTER — Encounter (HOSPITAL_BASED_OUTPATIENT_CLINIC_OR_DEPARTMENT_OTHER)
Admission: RE | Disposition: A | Discharge status: Home / Self Care | Payer: Self-pay | Source: Home / Self Care | Attending: Surgery

## 2023-05-05 ENCOUNTER — Other Ambulatory Visit: Payer: Self-pay

## 2023-05-05 ENCOUNTER — Ambulatory Visit (HOSPITAL_BASED_OUTPATIENT_CLINIC_OR_DEPARTMENT_OTHER)
Admission: RE | Admit: 2023-05-05 | Discharge: 2023-05-05 | Disposition: A | Discharge status: Home / Self Care | Payer: 59 | Attending: Surgery | Admitting: Surgery

## 2023-05-05 DIAGNOSIS — D242 Benign neoplasm of left breast: Secondary | ICD-10-CM

## 2023-05-05 DIAGNOSIS — N6489 Other specified disorders of breast: Secondary | ICD-10-CM | POA: Insufficient documentation

## 2023-05-05 DIAGNOSIS — D241 Benign neoplasm of right breast: Secondary | ICD-10-CM

## 2023-05-05 DIAGNOSIS — Z87891 Personal history of nicotine dependence: Secondary | ICD-10-CM

## 2023-05-05 DIAGNOSIS — N6081 Other benign mammary dysplasias of right breast: Secondary | ICD-10-CM | POA: Insufficient documentation

## 2023-05-05 DIAGNOSIS — N6041 Mammary duct ectasia of right breast: Secondary | ICD-10-CM | POA: Insufficient documentation

## 2023-05-05 HISTORY — PX: BREAST LUMPECTOMY WITH RADIOACTIVE SEED LOCALIZATION: SHX6424

## 2023-05-05 SURGERY — BREAST LUMPECTOMY WITH RADIOACTIVE SEED LOCALIZATION
Anesthesia: General | Site: Breast | Laterality: Bilateral

## 2023-05-05 MED ORDER — BUPIVACAINE HCL (PF) 0.5 % IJ SOLN
INTRAMUSCULAR | Status: AC
  Filled 2023-05-05: qty 30

## 2023-05-05 MED ORDER — CEFAZOLIN SODIUM-DEXTROSE 2-4 GM/100ML-% IV SOLN
INTRAVENOUS | Status: AC
  Filled 2023-05-05: qty 100

## 2023-05-05 MED ORDER — EPHEDRINE SULFATE-NACL 50-0.9 MG/10ML-% IV SOSY
PREFILLED_SYRINGE | INTRAVENOUS | Status: DC | PRN
  Administered 2023-05-05: 5 mg via INTRAVENOUS

## 2023-05-05 MED ORDER — ACETAMINOPHEN 500 MG PO TABS
1000.0000 mg | ORAL_TABLET | ORAL | Status: AC
  Administered 2023-05-05: 1000 mg via ORAL

## 2023-05-05 MED ORDER — FENTANYL CITRATE (PF) 100 MCG/2ML IJ SOLN
INTRAMUSCULAR | Status: AC
  Filled 2023-05-05: qty 2

## 2023-05-05 MED ORDER — CEFAZOLIN SODIUM-DEXTROSE 2-4 GM/100ML-% IV SOLN
2.0000 g | INTRAVENOUS | Status: AC
  Administered 2023-05-05: 2 g via INTRAVENOUS

## 2023-05-05 MED ORDER — PROPOFOL 10 MG/ML IV BOLUS
INTRAVENOUS | Status: DC | PRN
  Administered 2023-05-05: 200 mg via INTRAVENOUS

## 2023-05-05 MED ORDER — MIDAZOLAM HCL 2 MG/2ML IJ SOLN
INTRAMUSCULAR | Status: AC
  Filled 2023-05-05: qty 2

## 2023-05-05 MED ORDER — FENTANYL CITRATE (PF) 100 MCG/2ML IJ SOLN
25.0000 ug | INTRAMUSCULAR | Status: DC | PRN
  Administered 2023-05-05: 50 ug via INTRAVENOUS

## 2023-05-05 MED ORDER — LACTATED RINGERS IV SOLN: INTRAVENOUS | Status: DC

## 2023-05-05 MED ORDER — ONDANSETRON HCL 4 MG/2ML IJ SOLN
INTRAMUSCULAR | Status: AC
  Filled 2023-05-05: qty 2

## 2023-05-05 MED ORDER — PHENYLEPHRINE 80 MCG/ML (10ML) SYRINGE FOR IV PUSH (FOR BLOOD PRESSURE SUPPORT)
PREFILLED_SYRINGE | INTRAVENOUS | Status: DC | PRN
  Administered 2023-05-05 (×4): 160 ug via INTRAVENOUS

## 2023-05-05 MED ORDER — ACETAMINOPHEN 500 MG PO TABS
ORAL_TABLET | ORAL | Status: AC
  Filled 2023-05-05: qty 2

## 2023-05-05 MED ORDER — BUPIVACAINE HCL (PF) 0.5 % IJ SOLN
INTRAMUSCULAR | Status: DC | PRN
  Administered 2023-05-05: 20 mL

## 2023-05-05 MED ORDER — TRAMADOL HCL 50 MG PO TABS: 50.0000 mg | ORAL_TABLET | Freq: Four times a day (QID) | ORAL | 0 refills | Status: AC | PRN

## 2023-05-05 MED ORDER — LIDOCAINE 2% (20 MG/ML) 5 ML SYRINGE
INTRAMUSCULAR | Status: DC | PRN
  Administered 2023-05-05: 100 mg via INTRAVENOUS

## 2023-05-05 MED ORDER — DEXAMETHASONE SODIUM PHOSPHATE 10 MG/ML IJ SOLN
INTRAMUSCULAR | Status: DC | PRN
  Administered 2023-05-05: 10 mg via INTRAVENOUS

## 2023-05-05 MED ORDER — CHLORHEXIDINE GLUCONATE CLOTH 2 % EX PADS: 6.0000 | MEDICATED_PAD | Freq: Once | CUTANEOUS | Status: DC

## 2023-05-05 MED ORDER — PHENYLEPHRINE 80 MCG/ML (10ML) SYRINGE FOR IV PUSH (FOR BLOOD PRESSURE SUPPORT)
PREFILLED_SYRINGE | INTRAVENOUS | Status: AC
  Filled 2023-05-05: qty 10

## 2023-05-05 MED ORDER — LIDOCAINE 2% (20 MG/ML) 5 ML SYRINGE
INTRAMUSCULAR | Status: AC
  Filled 2023-05-05: qty 5

## 2023-05-05 MED ORDER — MIDAZOLAM HCL 5 MG/5ML IJ SOLN
INTRAMUSCULAR | Status: DC | PRN
  Administered 2023-05-05: 2 mg via INTRAVENOUS

## 2023-05-05 MED ORDER — PROPOFOL 10 MG/ML IV BOLUS
INTRAVENOUS | Status: AC
  Filled 2023-05-05: qty 20

## 2023-05-05 MED ORDER — DEXAMETHASONE SODIUM PHOSPHATE 10 MG/ML IJ SOLN
INTRAMUSCULAR | Status: AC
  Filled 2023-05-05: qty 1

## 2023-05-05 MED ORDER — FENTANYL CITRATE (PF) 100 MCG/2ML IJ SOLN
INTRAMUSCULAR | Status: DC | PRN
  Administered 2023-05-05 (×4): 25 ug via INTRAVENOUS

## 2023-05-05 MED ORDER — ONDANSETRON HCL 4 MG/2ML IJ SOLN
INTRAMUSCULAR | Status: DC | PRN
  Administered 2023-05-05: 4 mg via INTRAVENOUS

## 2023-05-05 SURGICAL SUPPLY — 52 items
ADH SKN CLS APL DERMABOND .7 (GAUZE/BANDAGES/DRESSINGS) ×2
APL PRP STRL LF DISP 70% ISPRP (MISCELLANEOUS) ×1
APPLIER CLIP 9.375 MED OPEN (MISCELLANEOUS)
APR CLP MED 9.3 20 MLT OPN (MISCELLANEOUS)
BINDER BREAST 3XL (GAUZE/BANDAGES/DRESSINGS) IMPLANT
BINDER BREAST LRG (GAUZE/BANDAGES/DRESSINGS) IMPLANT
BINDER BREAST MEDIUM (GAUZE/BANDAGES/DRESSINGS) IMPLANT
BINDER BREAST XLRG (GAUZE/BANDAGES/DRESSINGS) IMPLANT
BINDER BREAST XXLRG (GAUZE/BANDAGES/DRESSINGS) IMPLANT
BLADE SURG 15 STRL LF DISP TIS (BLADE) ×1 IMPLANT
BLADE SURG 15 STRL SS (BLADE) ×1
CANISTER SUC SOCK COL 7IN (MISCELLANEOUS) IMPLANT
CANISTER SUCT 1200ML W/VALVE (MISCELLANEOUS) IMPLANT
CHLORAPREP W/TINT 26 (MISCELLANEOUS) ×1 IMPLANT
CLIP APPLIE 9.375 MED OPEN (MISCELLANEOUS) IMPLANT
COVER BACK TABLE 60X90IN (DRAPES) ×1 IMPLANT
COVER MAYO STAND STRL (DRAPES) ×1 IMPLANT
COVER PROBE CYLINDRICAL 5X96 (MISCELLANEOUS) ×1 IMPLANT
DERMABOND ADVANCED .7 DNX12 (GAUZE/BANDAGES/DRESSINGS) ×1 IMPLANT
DRAPE LAPAROSCOPIC ABDOMINAL (DRAPES) ×1 IMPLANT
DRAPE UTILITY XL STRL (DRAPES) ×1 IMPLANT
ELECT REM PT RETURN 9FT ADLT (ELECTROSURGICAL) ×1
ELECTRODE REM PT RTRN 9FT ADLT (ELECTROSURGICAL) ×1 IMPLANT
GAUZE SPONGE 4X4 12PLY STRL LF (GAUZE/BANDAGES/DRESSINGS) IMPLANT
GLOVE BIOGEL PI IND STRL 7.0 (GLOVE) IMPLANT
GLOVE BIOGEL PI IND STRL 7.5 (GLOVE) IMPLANT
GLOVE SURG SIGNA 7.5 PF LTX (GLOVE) ×1 IMPLANT
GLOVE SURG SYN 7.5  E (GLOVE) ×1
GLOVE SURG SYN 7.5 E (GLOVE) ×1 IMPLANT
GLOVE SURG SYN 7.5 PF PI (GLOVE) IMPLANT
GOWN STRL REUS W/ TWL LRG LVL3 (GOWN DISPOSABLE) ×1 IMPLANT
GOWN STRL REUS W/ TWL XL LVL3 (GOWN DISPOSABLE) ×1 IMPLANT
GOWN STRL REUS W/TWL LRG LVL3 (GOWN DISPOSABLE) ×1
GOWN STRL REUS W/TWL XL LVL3 (GOWN DISPOSABLE) ×1
KIT MARKER MARGIN INK (KITS) ×1 IMPLANT
NDL HYPO 25X1 1.5 SAFETY (NEEDLE) ×1 IMPLANT
NEEDLE HYPO 25X1 1.5 SAFETY (NEEDLE) ×1 IMPLANT
NS IRRIG 1000ML POUR BTL (IV SOLUTION) IMPLANT
PACK BASIN DAY SURGERY FS (CUSTOM PROCEDURE TRAY) ×1 IMPLANT
PENCIL SMOKE EVACUATOR (MISCELLANEOUS) ×1 IMPLANT
SLEEVE SCD COMPRESS KNEE MED (STOCKING) ×1 IMPLANT
SPIKE FLUID TRANSFER (MISCELLANEOUS) IMPLANT
SPONGE T-LAP 4X18 ~~LOC~~+RFID (SPONGE) ×1 IMPLANT
SUT MNCRL AB 4-0 PS2 18 (SUTURE) ×1 IMPLANT
SUT SILK 2 0 SH (SUTURE) IMPLANT
SUT VIC AB 3-0 SH 27 (SUTURE) ×1
SUT VIC AB 3-0 SH 27X BRD (SUTURE) ×1 IMPLANT
SYR CONTROL 10ML LL (SYRINGE) ×1 IMPLANT
TOWEL GREEN STERILE FF (TOWEL DISPOSABLE) ×1 IMPLANT
TRAY FAXITRON CT DISP (TRAY / TRAY PROCEDURE) ×1 IMPLANT
TUBE CONNECTING 20X1/4 (TUBING) IMPLANT
YANKAUER SUCT BULB TIP NO VENT (SUCTIONS) IMPLANT

## 2023-05-05 NOTE — Interval H&P Note (Signed)
History and Physical Interval Note: no change in H and P  05/05/2023 8:23 AM  Amy [name]  has presented today for surgery, with the diagnosis of BILATERAL BREAST INTRADUCTAL PAPILLOMA.  The various methods of treatment have been discussed with the patient and family. After consideration of risks, benefits and other options for treatment, the patient has consented to  Procedure(s): BILATERAL BREAST LUMPECTOMY WITH RADIOACTIVE SEED LOCALIZATION (Bilateral) as a surgical intervention.  The patient's history has been reviewed, patient examined, no change in status, stable for surgery.  I have reviewed the patient's chart and labs.  Questions were answered to the patient's satisfaction.     Abigail Miyamoto

## 2023-05-05 NOTE — Anesthesia Preprocedure Evaluation (Addendum)
Anesthesia Evaluation  Patient identified by MRN, date of birth, ID band Patient awake    Reviewed: Allergy & Precautions, NPO status , Patient's Chart, lab work & pertinent test results  Airway Mallampati: II  TM Distance: >3 FB Neck ROM: Full    Dental no notable dental hx. (+) Teeth Intact, Dental Advisory Given   Pulmonary pneumonia, resolved, former smoker   Pulmonary exam normal breath sounds clear to auscultation       Cardiovascular negative cardio ROS Normal cardiovascular exam Rhythm:Regular Rate:Normal     Neuro/Psych  Headaches  negative psych ROS   GI/Hepatic negative GI ROS, Neg liver ROS,,,  Endo/Other  negative endocrine ROS    Renal/GU negative Renal ROS  negative genitourinary   Musculoskeletal negative musculoskeletal ROS (+)    Abdominal   Peds  Hematology negative hematology ROS (+)   Anesthesia Other Findings   Reproductive/Obstetrics                             Anesthesia Physical Anesthesia Plan  ASA: 2  Anesthesia Plan: General   Post-op Pain Management: Tylenol PO (pre-op)*   Induction: Intravenous  PONV Risk Score and Plan: 3 and Ondansetron, Dexamethasone and Midazolam  Airway Management Planned: LMA  Additional Equipment:   Intra-op Plan:   Post-operative Plan: Extubation in OR  Informed Consent: I have reviewed the patients History and Physical, chart, labs and discussed the procedure including the risks, benefits and alternatives for the proposed anesthesia with the patient or authorized representative who has indicated his/her understanding and acceptance.     Dental advisory given  Plan Discussed with: CRNA  Anesthesia Plan Comments:        Anesthesia Quick Evaluation

## 2023-05-05 NOTE — Anesthesia Postprocedure Evaluation (Signed)
Anesthesia Post Note  Patient: MIKALIA HEM  Procedure(s) Performed: BILATERAL BREAST LUMPECTOMY WITH RADIOACTIVE SEED LOCALIZATION (Bilateral: Breast)     Patient location during evaluation: PACU Anesthesia Type: General Level of consciousness: awake and alert Pain management: pain level controlled Vital Signs Assessment: post-procedure vital signs reviewed and stable Respiratory status: spontaneous breathing, nonlabored ventilation, respiratory function stable and patient connected to nasal cannula oxygen Cardiovascular status: blood pressure returned to baseline and stable Postop Assessment: no apparent nausea or vomiting Anesthetic complications: no  No notable events documented.  Last Vitals:  Vitals:   05/05/23 1115 05/05/23 1130  BP: 124/76 130/87  Pulse: 65 73  Resp: (!) 9 16  Temp:  (!) 36.2 C  SpO2: 91% 94%    Last Pain:  Vitals:   05/05/23 1125  TempSrc:   PainSc: 1                  Lillis Nuttle L Airabella Barley

## 2023-05-05 NOTE — Transfer of Care (Signed)
Immediate Anesthesia Transfer of Care Note  Patient: Amy [name]  Procedure(s) Performed: BILATERAL BREAST LUMPECTOMY WITH RADIOACTIVE SEED LOCALIZATION (Bilateral: Breast)  Patient Location: PACU  Anesthesia Type:General  Level of Consciousness: awake, alert , and oriented  Airway & Oxygen Therapy: Patient Spontanous Breathing and Patient connected to face mask oxygen  Post-op Assessment: Report given to RN and Post -op Vital signs reviewed and stable  Post vital signs: Reviewed and stable  Last Vitals:  Vitals Value Taken Time  BP 131/86 05/05/23 1040  Temp    Pulse 77 05/05/23 1041  Resp 23 05/05/23 1041  SpO2 99 % 05/05/23 1041  Vitals shown include unvalidated device data.  Last Pain:  Vitals:   05/05/23 0809  TempSrc: Temporal  PainSc: 0-No pain      Patients Stated Pain Goal: 3 (05/05/23 0809)  Complications: No notable events documented.

## 2023-05-05 NOTE — Op Note (Signed)
BILATERAL BREAST LUMPECTOMY WITH RADIOACTIVE SEED LOCALIZATION  Procedure Note  Amy [name] 05/05/2023   Pre-op Diagnosis: BILATERAL BREAST INTRADUCTAL PAPILLOMA     Post-op Diagnosis: same  Procedure(s): BILATERAL BREAST LUMPECTOMY WITH RADIOACTIVE SEED LOCALIZATION  Surgeon(s): Abigail Miyamoto, MD  Anesthesia: General  Staff:  Circulator: Lenn Cal, RN; Janace Aris, RN Scrub Person: Paulita Fujita A  Estimated Blood Loss: Minimal               Specimens: sent to path  Indications: This is a 62 year old female who was found to have abnormalities in both her breast on screening mammography.  She underwent bilateral breast biopsy showing an intraductal papilloma on each side.  The decision was made to proceed with radioactive seed guided bilateral breast lumpectomies  Procedure: The patient was brought to the operating room and identified as the correct patient.  She was placed upon the operating table and general anesthesia was induced.  Her bilateral breast and chest were then prepped and draped in the usual sterile fashion.  The radioactive seed on the right side was localized first of the neoprobe.  This was at the 9 o'clock position extremely laterally.  I do anesthetized the skin over the top of this area with Marcaine and the made incision with a scalpel.  I then dissected circumferentially down to the breast tissue with electrocautery.  I then grasped the lumpectomy specimen and elevated it.  As I was doing this the radioactive seed came out of the specimen was placed in a cup and x-rayed separately.  I then completed lumpectomy staying widely around where the seed is been.  I painted the margins of the specimen with paint.  An x-ray was performed confirming the previous biopsy clip and calcifications were in the specimen.  The specimen was then sent to pathology for evaluation. I then used the neoprobe to identify the left breast radioactive seed which was  located at 3 o'clock position several centimeters from the nipple as well.  I again anesthetized skin with Marcaine made incision over the top of the signal with the scalpel.  I then dissected again down to the deep breast tissue with electrocautery.  I continued the circumferential dissection with the aid of the neoprobe to I got to the area of the seed and then completed lumpectomy staying posterior to the radioactive seed signal with the cautery.  Again the lumpectomy specimen margins were marked with paint.  An x-ray was performed confirming the radioactive seed and biopsy clip were in the specimen.  The specimen was then sent to pathology labeled as the left breast lumpectomy site.  Hemostasis was achieved to both incisions.  Both incisions were then closed with 3-0 Vicryl subicular sutures in an interrupted fashion and then 4-0 running Monocryl sutures.  Dermabond was then applied to both sides.  The patient tolerated the procedure well.  All the counts were correct at the end of the procedure.  The patient was then extubated in the operating room and taken in a stable condition to the recovery room.          Abigail Miyamoto   Date: 05/05/2023  Time: 10:32 AM

## 2023-05-05 NOTE — Anesthesia Procedure Notes (Signed)
Procedure Name: LMA Insertion Date/Time: 05/05/2023 9:41 AM  Performed by: Pearson Grippe, CRNAPre-anesthesia Checklist: Patient identified, Emergency Drugs available, Suction available and Patient being monitored Patient Re-evaluated:Patient Re-evaluated prior to induction Oxygen Delivery Method: Circle system utilized Preoxygenation: Pre-oxygenation with 100% oxygen Induction Type: IV induction Ventilation: Mask ventilation without difficulty LMA: LMA inserted LMA Size: 4.0 Number of attempts: 1 Airway Equipment and Method: Bite block Placement Confirmation: positive ETCO2 Tube secured with: Tape Dental Injury: Teeth and Oropharynx as per pre-operative assessment

## 2023-05-05 NOTE — Discharge Instructions (Addendum)
[facility] Office Phone Number [phone]  BREAST BIOPSY/ PARTIAL MASTECTOMY: POST OP INSTRUCTIONS  Always review your discharge instruction sheet given to you by the facility where your surgery was performed.  IF YOU HAVE DISABILITY OR FAMILY LEAVE FORMS, YOU MUST BRING THEM TO THE OFFICE FOR PROCESSING.  DO NOT GIVE THEM TO YOUR DOCTOR.  A prescription for pain medication may be given to you upon discharge.  Take your pain medication as prescribed, if needed.  If narcotic pain medicine is not needed, then you may take acetaminophen (Tylenol) or ibuprofen (Advil) as needed. Take your usually prescribed medications unless otherwise directed If you need a refill on your pain medication, please contact your pharmacy.  They will contact our office to request authorization.  Prescriptions will not be filled after 5pm or on week-ends. You should eat very light the first 24 hours after surgery, such as soup, crackers, pudding, etc.  Resume your normal diet the day after surgery. Most patients will experience some swelling and bruising in the breast.  Ice packs and a good support bra will help.  Swelling and bruising can take several days to resolve.  It is common to experience some constipation if taking pain medication after surgery.  Increasing fluid intake and taking a stool softener will usually help or prevent this problem from occurring.  A mild laxative (Milk of Magnesia or Miralax) should be taken according to package directions if there are no bowel movements after 48 hours. Unless discharge instructions indicate otherwise, you may remove your bandages 24-48 hours after surgery, and you may shower at that time.  You may have steri-strips (small skin tapes) in place directly over the incision.  These strips should be left on the skin for 7-10 days.  If your surgeon used skin glue on the incision, you may shower in 24 hours.  The glue will flake off over the next 2-3 weeks.  Any  sutures or staples will be removed at the office during your follow-up visit. ACTIVITIES:  You may resume regular daily activities (gradually increasing) beginning the next day.  Wearing a good support bra or sports bra minimizes pain and swelling.  You may have sexual intercourse when it is comfortable. You may drive when you no longer are taking prescription pain medication, you can comfortably wear a seatbelt, and you can safely maneuver your car and apply brakes. RETURN TO WORK:  ______________________________________________________________________________________ You should see your doctor in the office for a follow-up appointment approximately two weeks after your surgery.  Your doctor's nurse will typically make your follow-up appointment when she calls you with your pathology report.  Expect your pathology report 2-3 business days after your surgery.  You may call to check if you do not hear from us after three days. OTHER INSTRUCTIONS: _______________________________________________________________________________________________ _____________________________________________________________________________________________________________________________________ _____________________________________________________________________________________________________________________________________ _____________________________________________________________________________________________________________________________________  WHEN TO CALL YOUR DOCTOR: Fever over 101.0 Nausea and/or vomiting. Extreme swelling or bruising. Continued bleeding from incision. Increased pain, redness, or drainage from the incision.  The clinic staff is available to answer your questions during regular business hours.  Please don't hesitate to call and ask to speak to one of the nurses for clinical concerns.  If you have a medical emergency, go to the nearest emergency room or call 911.  A surgeon from [facility] is always on call at the hospital.  For further questions, please visit [email or web address]    Post Anesthesia Home Care Instructions  Activity: Get plenty of rest for the remainder of   the day. A responsible individual must stay with you for 24 hours following the procedure.  For the next 24 hours, DO NOT: -Drive a car -Advertising copywriter -Drink alcoholic beverages -Take any medication unless instructed by your physician -Make any legal decisions or sign important papers.  Meals: Start with liquid foods such as gelatin or soup. Progress to regular foods as tolerated. Avoid greasy, spicy, heavy foods. If nausea and/or vomiting occur, drink only clear liquids until the nausea and/or vomiting subsides. Call your physician if vomiting continues.  Special Instructions/Symptoms: Your throat may feel dry or sore from the anesthesia or the breathing tube placed in your throat during surgery. If this causes discomfort, gargle with warm salt water. The discomfort should disappear within 24 hours.  Tylenol can be taken after 2:10 pm if needed

## 2023-05-06 ENCOUNTER — Encounter (HOSPITAL_BASED_OUTPATIENT_CLINIC_OR_DEPARTMENT_OTHER): Payer: Self-pay | Admitting: Surgery

## 2023-05-08 ENCOUNTER — Other Ambulatory Visit: Payer: Self-pay

## 2023-05-08 DIAGNOSIS — N760 Acute vaginitis: Secondary | ICD-10-CM

## 2023-05-08 MED ORDER — TINIDAZOLE 500 MG PO TABS
ORAL_TABLET | ORAL | 0 refills | Status: AC
Start: 2023-05-08 — End: ?

## 2023-05-09 LAB — SURGICAL PATHOLOGY

## 2023-08-15 ENCOUNTER — Encounter: Payer: Self-pay | Admitting: Emergency Medicine

## 2023-08-15 ENCOUNTER — Other Ambulatory Visit: Payer: Self-pay

## 2023-08-15 ENCOUNTER — Ambulatory Visit
Admission: EM | Admit: 2023-08-15 | Discharge: 2023-08-15 | Disposition: A | Discharge status: Home / Self Care | Payer: 59 | Attending: Internal Medicine | Admitting: Internal Medicine

## 2023-08-15 DIAGNOSIS — B079 Viral wart, unspecified: Secondary | ICD-10-CM | POA: Diagnosis not present

## 2023-08-15 MED ORDER — SALICYLIC ACID 17 % EX GEL: Freq: Every day | CUTANEOUS | 0 refills | Status: AC

## 2023-08-15 NOTE — ED Provider Notes (Addendum)
[facility]    CSN: [id number] Arrival date & time: 08/15/23  1257      History   Chief Complaint Chief Complaint  Patient presents with   Hand Pain    HPI Amy [name] is a 62 y.o. female.   Patient presents with swollen area to right thumb that has been intermittent over the past few years.  Reports that it flared back up about 3 weeks ago.  Denies any injury to the area.     Hand Pain    Past Medical History:  Diagnosis Date   Anemia    Fibroids    Headache(784.0)    "mostly related to weather changing"   Pneumonia 06/30/2012   Syphilis 2-3 years ago   tx and resolved   Trichomonas 2 years ago   tx and resolved 60yrs ago & 02/2023    Patient Active Problem List   Diagnosis Date Noted   Colonic diverticular abscess 10/30/2021   Sigmoid diverticulitis 03/05/2013   Pneumonia, community acquired 06/30/2012    Past Surgical History:  Procedure Laterality Date   BREAST BIOPSY Left 02/28/2023   [place] LT BREAST BX W LOC DEV 1ST LESION IMG BX SPEC US GUIDE 02/28/2023 [facility]   BREAST BIOPSY Right 02/28/2023   MM RT BREAST BX W LOC DEV 1ST LESION IMAGE BX SPEC STEREO GUIDE 02/28/2023 [facility]   BREAST BIOPSY  05/02/2023   MM RT RADIOACTIVE SEED LOC MAMMO GUIDE 05/02/2023 [facility]   BREAST BIOPSY  05/02/2023   MM LT RADIOACTIVE SEED LOC MAMMO GUIDE 05/02/2023 [facility]   BREAST LUMPECTOMY WITH RADIOACTIVE SEED LOCALIZATION Bilateral 05/05/2023   Procedure: BILATERAL BREAST LUMPECTOMY WITH RADIOACTIVE SEED LOCALIZATION;  Surgeon: Abigail Miyamoto, MD;  Location: [facility];  Service: General;  Laterality: Bilateral;   CESAREAN SECTION  1993   GANGLION CYST EXCISION  05/2012   right wrist   TONSILLECTOMY     "when I was a little girl"    OB History     Gravida  2   Para  1   Term  1   Preterm      AB      Living  1      SAB      IAB      Ectopic      Multiple      Live Births  1             Home Medications    Prior to Admission medications   Medication Sig Start Date End Date Taking? Authorizing Provider  salicylic acid 17 % gel Apply topically daily. Soak wart in warm water for 5 minutes. Dry area thoroughly. Apply 1 drop to cover wart. Let dry. Repeat once or twice daily until wart is removed for up to 12 weeks. 08/15/23  Yes Justyce Yeater, Rolly Salter E, FNP  acetaminophen (TYLENOL) 325 MG tablet Take 650 mg by mouth every 6 (six) hours as needed for moderate pain or mild pain.    [provider]  B Complex-C (B-COMPLEX WITH VITAMIN C) tablet Take 1 tablet by mouth daily.    [provider]  Iron-Vitamins (GERITOL COMPLETE) TABS Take 1 tablet by mouth daily.    [provider]  tinidazole (TINDAMAX) 500 MG tablet Take 2 tabs po BID x 2 days 05/08/23   Genia Del, MD  traMADol (ULTRAM) 50 MG tablet Take 1 tablet (50 mg total) by mouth every 6 (six) hours as needed for  moderate pain or severe pain. 05/05/23   Abigail Miyamoto, MD  vitamin E 200 UNIT capsule Take 200 Units by mouth daily.    [provider]    Family History Family History  Problem Relation Age of Onset   Heart attack Sister    Lung cancer Sister     Social History Social History   Tobacco Use   Smoking status: Former    Current packs/day: 0.00    Average packs/day: 0.5 packs/day for 32.0 years (16.0 ttl pk-yrs)    Types: Cigarettes    Start date: 06/25/1980    Quit date: 06/25/2012    Years since quitting: 11.1   Smokeless tobacco: Never  Substance Use Topics   Alcohol use: Not Currently    Comment: occasionally   Drug use: No     Allergies   Patient has no known allergies.   Review of Systems Review of Systems Per HPI  Physical Exam Triage Vital Signs ED Triage Vitals  Encounter Vitals Group     BP 08/15/23 1333 138/85     Systolic BP Percentile --      Diastolic BP Percentile --      Pulse Rate 08/15/23 1333 76     Resp 08/15/23 1333 18      Temp 08/15/23 1333 98 F (36.7 C)     Temp Source 08/15/23 1333 Oral     SpO2 08/15/23 1333 96 %     Weight --      Height --      Head Circumference --      Peak Flow --      Pain Score 08/15/23 1334 3     Pain Loc --      Pain Education --      Exclude from Growth Chart --    No data found.  Updated Vital Signs BP 138/85 (BP Location: Left Arm)   Pulse 76   Temp 98 F (36.7 C) (Oral)   Resp 18   LMP 12/01/2011 Comment: not sexually active  SpO2 96%   Visual Acuity Right Eye Distance:   Left Eye Distance:   Bilateral Distance:    Right Eye Near:   Left Eye Near:    Bilateral Near:     Physical Exam Constitutional:      General: She is not in acute distress.    Appearance: Normal appearance. She is not toxic-appearing or diaphoretic.  HENT:     Head: Normocephalic and atraumatic.  Eyes:     Extraocular Movements: Extraocular movements intact.     Conjunctiva/sclera: Conjunctivae normal.  Pulmonary:     Effort: Pulmonary effort is normal.  Skin:    Comments: Patient has approximately 1 cm raised area present to mid right thumb.  No drainage or area of discoloration.   Patient has full range of motion of right thumb.  Neurological:     General: No focal deficit present.     Mental Status: She is alert and oriented to person, place, and time. Mental status is at baseline.  Psychiatric:        Mood and Affect: Mood normal.        Behavior: Behavior normal.        Thought Content: Thought content normal.        Judgment: Judgment normal.      UC Treatments / Results  Labs (all labs ordered are listed, but only abnormal results are displayed) Labs Reviewed - No data to display  EKG  Radiology No results found.  Procedures Procedures (including critical care time)  Medications Ordered in UC Medications - No data to display  Initial Impression / Assessment and Plan / UC Course  I have reviewed the triage vital signs and the nursing  notes.  Pertinent labs & imaging results that were available during my care of the patient were reviewed by me and considered in my medical decision making (see chart for details).     Differential diagnoses include wart versus cyst.  Physical exam is most consistent with wart so will trial salicylic acid topically.  Dermatology referral was placed for further evaluation and management. It is very large and may need removal which cannot be provided here at urgent care. Advised strict return precautions and supportive care.  Patient verbalized understanding and was agreeable with plan. Final Clinical Impressions(s) / UC Diagnoses   Final diagnoses:  Wart on thumb     Discharge Instructions      It appears that you most likely have a wart on your thumb.  I have prescribed medication to apply topically.  Dermatology referral has been placed.    ED Prescriptions     Medication Sig Dispense Auth. Provider   salicylic acid 17 % gel Apply topically daily. Soak wart in warm water for 5 minutes. Dry area thoroughly. Apply 1 drop to cover wart. Let dry. Repeat once or twice daily until wart is removed for up to 12 weeks. 14 g Gustavus Bryant, [place]      PDMP not reviewed this encounter.   Gustavus Bryant, [place] 08/15/23 1432    Gustavus Bryant, [place] 08/15/23 [phone]

## 2023-08-15 NOTE — Discharge Instructions (Signed)
It appears that you most likely have a wart on your thumb.  I have prescribed medication to apply topically.  Dermatology referral has been placed.

## 2023-08-15 NOTE — ED Triage Notes (Signed)
Pt here for swollen area to right thumb that has been there for years that has "popped" once before; pt sts swelling with increased pain x 3 weeks

## 2023-09-17 ENCOUNTER — Ambulatory Visit (INDEPENDENT_AMBULATORY_CARE_PROVIDER_SITE_OTHER): Payer: 59 | Admitting: Podiatry

## 2023-09-17 ENCOUNTER — Encounter: Payer: Self-pay | Admitting: Podiatry

## 2023-09-17 VITALS — BP 173/92 | HR 68

## 2023-09-17 DIAGNOSIS — L603 Nail dystrophy: Secondary | ICD-10-CM

## 2023-09-17 NOTE — Progress Notes (Signed)
Subjective:   Patient ID: Amy [name], female   DOB: 62 y.o.   MRN: [id number]   HPI Patient states she has pain in both big toenails and that it has been going on for couple months and without shoes she has no pain at this time.  Patient does not smoke likes to be active   Review of Systems  All other systems reviewed and are negative.       Objective:  Physical Exam Vitals and nursing note reviewed.  Constitutional:      Appearance: She is well-developed.  Pulmonary:     Effort: Pulmonary effort is normal.  Musculoskeletal:        General: Normal range of motion.  Skin:    General: Skin is warm.  Neurological:     Mental Status: She is alert.     Neurovascular status found to be intact muscle strength adequate range of motion was adequate with patient found to have thickened big toenails are dystrophic but localized with no other pathology noted no drainage no incurvation of the corners     Assessment:  Probability that this is nail damage and that it is related to some kind of a subtle injury process     Plan:  H&P reviewed condition and have recommended that she start soaks and it is possible that some form of nail procedure may be necessary in future if symptoms do not get better but at this point we are just can keep an eye on it
